# Patient Record
Sex: Female | Born: 1965 | Race: Black or African American | Hispanic: No | Marital: Married | Smoking: Never smoker
Health system: Southern US, Community
[De-identification: ages and names within clinical notes are randomized; demographics above are authoritative.]

## PROBLEM LIST (undated history)

## (undated) DIAGNOSIS — K219 Gastro-esophageal reflux disease without esophagitis: Secondary | ICD-10-CM

## (undated) DIAGNOSIS — T7840XA Allergy, unspecified, initial encounter: Secondary | ICD-10-CM

## (undated) DIAGNOSIS — D649 Anemia, unspecified: Secondary | ICD-10-CM

## (undated) DIAGNOSIS — F419 Anxiety disorder, unspecified: Secondary | ICD-10-CM

## (undated) DIAGNOSIS — R7303 Prediabetes: Secondary | ICD-10-CM

## (undated) HISTORY — DX: Anemia, unspecified: D64.9

## (undated) HISTORY — DX: Allergy, unspecified, initial encounter: T78.40XA

## (undated) HISTORY — PX: WRIST GANGLION EXCISION: SUR520

## (undated) HISTORY — PX: COSMETIC SURGERY: SHX468

## (undated) HISTORY — PX: KNEE ARTHROSCOPY: SUR90

## (undated) HISTORY — PX: BREAST REDUCTION SURGERY: SHX8

## (undated) HISTORY — PX: LAPAROSCOPIC GASTRIC BANDING: SHX1100

## (undated) HISTORY — PX: TONSILLECTOMY: SUR1361

## (undated) HISTORY — PX: NASAL SINUS SURGERY: SHX719

---

## 1998-05-14 ENCOUNTER — Encounter: Payer: Self-pay | Admitting: Obstetrics & Gynecology

## 1998-05-14 ENCOUNTER — Ambulatory Visit (HOSPITAL_COMMUNITY): Admission: RE | Admit: 1998-05-14 | Discharge: 1998-05-14 | Payer: Self-pay | Admitting: Obstetrics & Gynecology

## 1998-06-27 ENCOUNTER — Inpatient Hospital Stay (HOSPITAL_COMMUNITY): Admission: AD | Admit: 1998-06-27 | Discharge: 1998-06-27 | Payer: Self-pay | Admitting: Obstetrics and Gynecology

## 1998-07-31 ENCOUNTER — Encounter: Admission: RE | Admit: 1998-07-31 | Discharge: 1998-10-29 | Payer: Self-pay | Admitting: Obstetrics and Gynecology

## 1998-08-24 ENCOUNTER — Inpatient Hospital Stay (HOSPITAL_COMMUNITY): Admission: AD | Admit: 1998-08-24 | Discharge: 1998-08-24 | Payer: Self-pay | Admitting: Obstetrics and Gynecology

## 1998-08-29 ENCOUNTER — Inpatient Hospital Stay (HOSPITAL_COMMUNITY): Admission: AD | Admit: 1998-08-29 | Discharge: 1998-08-29 | Payer: Self-pay | Admitting: Obstetrics and Gynecology

## 1998-08-31 ENCOUNTER — Inpatient Hospital Stay (HOSPITAL_COMMUNITY): Admission: AD | Admit: 1998-08-31 | Discharge: 1998-09-02 | Payer: Self-pay | Admitting: *Deleted

## 2001-10-01 ENCOUNTER — Other Ambulatory Visit: Admission: RE | Admit: 2001-10-01 | Discharge: 2001-10-01 | Payer: Self-pay | Admitting: Obstetrics and Gynecology

## 2002-05-30 ENCOUNTER — Ambulatory Visit (HOSPITAL_COMMUNITY): Admission: RE | Admit: 2002-05-30 | Discharge: 2002-05-30 | Payer: Self-pay | Admitting: Family Medicine

## 2002-05-30 ENCOUNTER — Encounter: Payer: Self-pay | Admitting: Family Medicine

## 2002-12-25 ENCOUNTER — Other Ambulatory Visit: Admission: RE | Admit: 2002-12-25 | Discharge: 2002-12-25 | Payer: Self-pay | Admitting: Obstetrics and Gynecology

## 2003-03-13 ENCOUNTER — Encounter: Admission: RE | Admit: 2003-03-13 | Discharge: 2003-06-11 | Payer: Self-pay | Admitting: Obstetrics and Gynecology

## 2003-05-07 ENCOUNTER — Inpatient Hospital Stay (HOSPITAL_COMMUNITY): Admission: AD | Admit: 2003-05-07 | Discharge: 2003-05-07 | Payer: Self-pay | Admitting: Family Medicine

## 2003-09-11 ENCOUNTER — Inpatient Hospital Stay (HOSPITAL_COMMUNITY): Admission: AD | Admit: 2003-09-11 | Discharge: 2003-09-15 | Payer: Self-pay | Admitting: Pediatrics

## 2003-09-16 ENCOUNTER — Encounter: Admission: RE | Admit: 2003-09-16 | Discharge: 2003-10-16 | Payer: Self-pay | Admitting: Obstetrics and Gynecology

## 2003-10-17 ENCOUNTER — Encounter: Admission: RE | Admit: 2003-10-17 | Discharge: 2003-11-16 | Payer: Self-pay | Admitting: Obstetrics and Gynecology

## 2003-10-23 ENCOUNTER — Other Ambulatory Visit: Admission: RE | Admit: 2003-10-23 | Discharge: 2003-10-23 | Payer: Self-pay | Admitting: Obstetrics and Gynecology

## 2003-12-15 ENCOUNTER — Encounter: Admission: RE | Admit: 2003-12-15 | Discharge: 2004-01-14 | Payer: Self-pay | Admitting: Obstetrics and Gynecology

## 2004-02-14 ENCOUNTER — Encounter: Admission: RE | Admit: 2004-02-14 | Discharge: 2004-03-15 | Payer: Self-pay | Admitting: Obstetrics and Gynecology

## 2004-04-15 ENCOUNTER — Encounter: Admission: RE | Admit: 2004-04-15 | Discharge: 2004-05-15 | Payer: Self-pay | Admitting: Obstetrics and Gynecology

## 2004-05-16 ENCOUNTER — Encounter: Admission: RE | Admit: 2004-05-16 | Discharge: 2004-06-15 | Payer: Self-pay | Admitting: Obstetrics and Gynecology

## 2004-07-16 ENCOUNTER — Encounter: Admission: RE | Admit: 2004-07-16 | Discharge: 2004-08-15 | Payer: Self-pay | Admitting: Obstetrics and Gynecology

## 2004-09-16 ENCOUNTER — Ambulatory Visit (HOSPITAL_COMMUNITY): Admission: RE | Admit: 2004-09-16 | Discharge: 2004-09-16 | Payer: Self-pay | Admitting: Family Medicine

## 2004-11-26 ENCOUNTER — Encounter: Admission: RE | Admit: 2004-11-26 | Discharge: 2005-02-24 | Payer: Self-pay | Admitting: Family Medicine

## 2005-05-16 ENCOUNTER — Encounter: Admission: RE | Admit: 2005-05-16 | Discharge: 2005-08-14 | Payer: Self-pay | Admitting: Surgery

## 2005-05-17 ENCOUNTER — Ambulatory Visit (HOSPITAL_COMMUNITY): Admission: RE | Admit: 2005-05-17 | Discharge: 2005-05-17 | Payer: Self-pay | Admitting: Surgery

## 2005-06-02 ENCOUNTER — Ambulatory Visit (HOSPITAL_COMMUNITY): Admission: RE | Admit: 2005-06-02 | Discharge: 2005-06-02 | Payer: Self-pay | Admitting: Surgery

## 2005-07-02 ENCOUNTER — Ambulatory Visit (HOSPITAL_BASED_OUTPATIENT_CLINIC_OR_DEPARTMENT_OTHER): Admission: RE | Admit: 2005-07-02 | Discharge: 2005-07-02 | Payer: Self-pay | Admitting: Surgery

## 2005-07-10 ENCOUNTER — Ambulatory Visit: Payer: Self-pay | Admitting: Internal Medicine

## 2006-03-02 ENCOUNTER — Encounter: Admission: RE | Admit: 2006-03-02 | Discharge: 2006-03-16 | Payer: Self-pay | Admitting: Family Medicine

## 2006-03-20 ENCOUNTER — Ambulatory Visit (HOSPITAL_COMMUNITY): Admission: RE | Admit: 2006-03-20 | Discharge: 2006-03-21 | Payer: Self-pay | Admitting: Surgery

## 2006-03-20 ENCOUNTER — Encounter (INDEPENDENT_AMBULATORY_CARE_PROVIDER_SITE_OTHER): Payer: Self-pay | Admitting: Specialist

## 2006-04-13 ENCOUNTER — Encounter: Admission: RE | Admit: 2006-04-13 | Discharge: 2006-04-28 | Payer: Self-pay | Admitting: Family Medicine

## 2006-11-02 ENCOUNTER — Emergency Department (HOSPITAL_COMMUNITY): Admission: EM | Admit: 2006-11-02 | Discharge: 2006-11-03 | Payer: Self-pay | Admitting: Emergency Medicine

## 2009-06-17 ENCOUNTER — Ambulatory Visit: Payer: Self-pay | Admitting: Diagnostic Radiology

## 2009-06-17 ENCOUNTER — Emergency Department (HOSPITAL_BASED_OUTPATIENT_CLINIC_OR_DEPARTMENT_OTHER): Admission: EM | Admit: 2009-06-17 | Discharge: 2009-06-17 | Payer: Self-pay | Admitting: Emergency Medicine

## 2011-01-14 NOTE — Procedures (Signed)
NAME:  Rachael White, Rachael White              ACCOUNT NO.:  [id number]   MEDICAL RECORD NO.:  [id number]          PATIENT TYPE:  OUT   LOCATION:  SLEEP CENTER                 FACILITY:  [facility]   PHYSICIAN:  Clinton D. Maple Hudson, M.D. DATE OF BIRTH:  11/05/1965   DATE OF STUDY:  07/02/2005                              NOCTURNAL POLYSOMNOGRAM   REFERRING PHYSICIAN:  Thornton Park. Daphine Deutscher, MD.   INDICATION FOR STUDY:  Hypersomnia with sleep apnea.   EPWORTH SLEEPINESS SCORE:  12/24.  BMI 45, weight 287 pounds.   MEDICATIONS:  Home medications include Adderall, Nexium and a vitamin.   SLEEP ARCHITECTURE:  Total sleep time 397 minutes with sleep efficiency 96%.  Stage 1 4%, stage 2 54%, stages 3 and 4 21%.  REM 21% of total sleep time.  Sleep latency 8 minutes.  REM latency 69 minutes.  Awake after sleep onset  10 minutes.  Arousal index 16.  No bedtime medication taken.  Sleep  architecture was noteworthy for increased stages 3 and 4.  This may be seen  with rebound from previous sleep deprivation.   RESPIRATORY DATA:  Apnea/hypopnea index (AHI, RDI) 7.4 obstructive events  per hour indicating mild obstructive sleep apnea/hypopnea syndrome.  This  reflected 27 obstructive apneas and 22 hypopneas.  Events were associated  primarily with sleep on left side and right side.  She slept little supine.  REM AHI 18.8 per hour.  There were insufficient events to qualify for C-PAP  titration by split study protocol on this study night.   OXYGEN DATA:  Moderate snoring with oxygen desaturation to a nadir of 85%.  Mean oxygen saturation through the study was 98% on room air.   CARDIAC DATA:  Normal sinus rhythm.   MOVEMENT-PARASOMNIA:  A total of 68 limb jerks were recorded of which 12  were associated with arousal or awakening for a periodic limb movement with  arousal index of 1.8 per hour which is probably insignificant.   IMPRESSIONS-RECOMMENDATIONS:  1.  Sleep architecture significant for increased delta  sleep (stages 3 and      4) possibly on basis of rebound from previous sleep deprivation.  2.  Mild obstructive sleep apnea/hypopnea syndrome, AHI 7.4 per hour noted      mostly while in REM.  There was moderate snoring with oxygen      desaturation to 85%.  3.  C-PAP would not usually be used to treat scores in this range.      Conservative therapy including weight loss and      treatment for any nasal congestion would be considered first.      Occasionally REM suppressant therapy with a tricyclic antidepressant      might be useful.      Clinton D. Maple Hudson, M.D.  Diplomate, Biomedical engineer of Sleep Medicine  Electronically Signed     CDY/MEDQ  D:  07/10/2005 09:02:50  T:  07/11/2005 09:42:01  Job:  [id number]

## 2011-01-14 NOTE — Op Note (Signed)
Rachael White, Rachael White              ACCOUNT NO.:  [id number]   MEDICAL RECORD NO.:  [id number]          PATIENT TYPE:  AMB   LOCATION:  DAY                          FACILITY:  [facility]   PHYSICIAN:  Thornton Park. Daphine Deutscher, MD  DATE OF BIRTH:  July 27, 1966   DATE OF PROCEDURE:  DATE OF DISCHARGE:                                 OPERATIVE REPORT   PREOPERATIVE DIAGNOSIS:  Morbid obesity.   POSTOPERATIVE DIAGNOSIS:  Morbid obesity, status post laparoscopic truncal  vagotomy, laparoscopic hiatal hernia repair, and placement of 10 cm  laparoscopic adjustable gastric band (Allergan).   SURGEON:  Thornton Park. Daphine Deutscher, MD   ASSISTANT:  Alfonse Ras, MD   ANESTHESIA:  General endotracheal.   DESCRIPTION OF PROCEDURE:  Ms. Rachael White is 45 years old, was taken to room 1 on  07/23 and given general anesthesia.  Abdomen was prepped with Hibiclens and  draped sterilely.  Access to the abdomen was gained through the left upper  quadrant using an OptiVu.  Although she was very deep we entered without  difficulty and the abdomen was insufflated.  Two placed in the right side  and a 10 on the left side and 5 in the upper midline and auxiliary 5 lateral  to the initial one were placed in our standard fashion.  The Nathanson's  retractor was placed a through a 5 mm port in the upper abdomen retracting  the liver.   The dissection began by taking down the gastrohepatic ligament and then  opening just medial to the right crus and creating a space retroesophageal  region where I was able to dissect away the right posterior vagal trunk.  This was then clipped and removed and this was grossly inspected and felt to  be vagus.   Anteriorly I dissected the anterior vagal trunk.  It was a little more  difficult to find but I found it pretty much on the left anterior lateral  wall and again clipped it and removed it.  I also had reviewed her upper GI  and knew her to have a small hiatal hernia and I elected to go  ahead and put  a single stitch with the Endo stitch tying that extracorporeally.  The  sizing catheter was passed but that was done after we checked for the  __________ of the vagotomy.  This down with an upper endoscopy performed by  me and up in the endoscope was passed in the stomach, retroflexed.  I also  assessed for hiatal hernia and it was not a real large one but again I  closed the hiatus.  I went ahead and retroflexed and then did a Congo red  study after baclofen stimulation and after irrigating and there was all the  dye was red.  There was no change to a dark blue which would signify acid.   After the posterior hiatus was closed, I then created my posterior band  dissection and then passed a 10 cm band clipping it in place.  It was then  plicated with three sutures using Surgidac placed with free sutures.  Lap  ties  were placed in each of these.   The band had been introduced through a 15 port and at that site, I used cut  a cut down and created a space for the actual port site.  This was connected  and sutured to the fascia with interrupted 2-0 Prolene.   The patient's wounds were closed with 4-0 Vicryl, Benzoin and Steri-Strips.  She was awakened but was somewhat rowdy and rambunctious with awakening and  was taken to recovery room in satisfactory condition.      Thornton Park Daphine Deutscher, MD  Electronically Signed     MBM/MEDQ  D:  03/20/2006  T:  03/20/2006  Job:  [id number]   cc:   Holley Bouche, M.D.  Fax: [phone]

## 2011-02-18 ENCOUNTER — Ambulatory Visit (HOSPITAL_BASED_OUTPATIENT_CLINIC_OR_DEPARTMENT_OTHER)
Admission: RE | Admit: 2011-02-18 | Discharge: 2011-02-18 | Disposition: A | Discharge status: Home / Self Care | Payer: Managed Care, Other (non HMO) | Source: Ambulatory Visit | Attending: Specialist | Admitting: Specialist

## 2011-02-18 DIAGNOSIS — M2240 Chondromalacia patellae, unspecified knee: Secondary | ICD-10-CM | POA: Insufficient documentation

## 2011-02-18 DIAGNOSIS — M9420 Chondromalacia, unspecified site: Secondary | ICD-10-CM | POA: Insufficient documentation

## 2011-02-18 DIAGNOSIS — E669 Obesity, unspecified: Secondary | ICD-10-CM | POA: Insufficient documentation

## 2011-02-18 DIAGNOSIS — M23329 Other meniscus derangements, posterior horn of medial meniscus, unspecified knee: Secondary | ICD-10-CM | POA: Insufficient documentation

## 2011-02-18 DIAGNOSIS — M1710 Unilateral primary osteoarthritis, unspecified knee: Secondary | ICD-10-CM | POA: Insufficient documentation

## 2011-02-18 DIAGNOSIS — Z01812 Encounter for preprocedural laboratory examination: Secondary | ICD-10-CM | POA: Insufficient documentation

## 2011-02-26 NOTE — Op Note (Signed)
Rachael White, Rachael White NO.:  [id number]  MEDICAL RECORD NO.:  [id number]  LOCATION:                                 FACILITY:  PHYSICIAN:  Jene Every, M.D.    DATE OF BIRTH:  Nov 07, 1965  DATE OF PROCEDURE:  02/18/2011 DATE OF DISCHARGE:                              OPERATIVE REPORT   PREOPERATIVE DIAGNOSIS:  Degenerative joint disease, meniscus tear, left knee.  POSTOPERATIVE DIAGNOSES: 1. Grade III chondromalacia, patellofemoral joint. 2. Medial meniscus tear. 3. 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 4. 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  PROCEDURE PERFORMED: 1. Left knee arthroscopy. 2. Partial medial meniscectomy. 3. Chondroplasty of medial femoral condyle, femoral sulcus, patella     and tibial plateau.  HISTORY:  [age]-year-old with knee pain, locking, popping and giving way.  MRI indicating meniscus tear, indicated for arthroscopic intervention failing conservative treatment.  Risk and benefits discussed including bleeding, infection, no change in symptoms, worsening symptoms, need for repeat debridement, DVT, PE, anesthetic complications, etc.,  TECHNIQUE:  Patient in supine position after induction of adequate general anesthesia and 1 g of Kefzol, the left lower extremity was prepped and draped in the usual sterile fashion.  A lateral parapatellar portal and superomedial parapatellar portal was fashioned with a #11 blade.  Ingress cannula atraumatically placed.  Irrigant was utilized to insufflate the joint.  Under direct visualization, a medial parapatellar portal was fashioned with a #11 blade after localization with an 18 gauge needle, sparing the medial meniscus.  There was extensive grade III changes in the medial femoral condyle with chondral flap tears.  A 3.5 Cuda shaver  was introduced utilized to perform a light chondroplasty to a stable base.  In addition, a mildly complex tear of the posterior  third of the medial meniscus was noted unstable, introduced a basket rongeur and performed a partial medial meniscectomy to a stable base.  Further contoured with a 3.5 Cuda shaver. Approximately  one-third of the posterior third was excised.  No grade IV changes were noted.  ACL and PCL were unremarkable.  Lateral compartment revealed a chondral fissure in the lateral tibial plateau with some grade III changes.  Light chondroplasty performed here.  Some mild fraying of the meniscus.  This was shaved as well.  The remnant of the meniscus was, however, stable to probe palpation.  Examination of the suprapatellar pouch revealed extensive grade III changes of the patella.  There was normal patellofemoral tracking.  The sulcus was involved as well.  Light chondroplasty performed at the patella and the sulcus.  No grade IV changes noted, although fairly significant in terms of grade III changes.  Next, the knee was copiously lavaged.  All compartments were reexamined. No further pathology amenable for arthroscopic intervention, I therefore removed all instrumentation.  Portals were closed with 4-0 nylon simple sutures.  0.25% Marcaine with epinephrine was infiltrated in the joint and wound was dressed sterilely.  Awoken without difficulty and transported to recovery room in satisfactory condition.  Patient tolerated the procedure well.  COMPLICATIONS:  No complications.  ASSISTANT:  None.     Jene Every, M.D.     JB/MEDQ  D:  02/18/2011  T:  02/18/2011  Job:  [id number]  Electronically Signed by Jene Every M.D. on 02/26/2011 09:05:06 AM

## 2011-03-18 ENCOUNTER — Ambulatory Visit (HOSPITAL_BASED_OUTPATIENT_CLINIC_OR_DEPARTMENT_OTHER)
Admission: RE | Admit: 2011-03-18 | Discharge: 2011-03-18 | Disposition: A | Discharge status: Home / Self Care | Payer: Managed Care, Other (non HMO) | Source: Ambulatory Visit | Attending: Specialist | Admitting: Specialist

## 2011-03-18 DIAGNOSIS — M1710 Unilateral primary osteoarthritis, unspecified knee: Secondary | ICD-10-CM | POA: Insufficient documentation

## 2011-03-18 DIAGNOSIS — M23329 Other meniscus derangements, posterior horn of medial meniscus, unspecified knee: Secondary | ICD-10-CM | POA: Insufficient documentation

## 2011-03-18 DIAGNOSIS — M2240 Chondromalacia patellae, unspecified knee: Secondary | ICD-10-CM | POA: Insufficient documentation

## 2011-03-18 DIAGNOSIS — M2340 Loose body in knee, unspecified knee: Secondary | ICD-10-CM | POA: Insufficient documentation

## 2011-03-18 DIAGNOSIS — Z01812 Encounter for preprocedural laboratory examination: Secondary | ICD-10-CM | POA: Insufficient documentation

## 2011-03-21 LAB — POCT HEMOGLOBIN-HEMACUE: Hemoglobin: 8.1 g/dL — ABNORMAL LOW (ref 12.0–15.0)

## 2011-03-27 NOTE — Op Note (Signed)
  [name], Rachael White NO.:  [id number]  MEDICAL RECORD NO.:  [id number]  LOCATION:                                 FACILITY:  PHYSICIAN:  Jene Every, M.D.    DATE OF BIRTH:  06-26-1966  DATE OF PROCEDURE:  03/18/2011 DATE OF DISCHARGE:                              OPERATIVE REPORT   PREOPERATIVE DIAGNOSIS:  Degenerative joint disease, right knee.  POSTOPERATIVE DIAGNOSES:  Grade 3 chondromalacia, medial femoral condyle with loose cartilaginous debris and medial meniscus tear; grade 3 chondromalacia of lateral compartment and tibial plateau, minor fraying of the lateral meniscus; grade 3 chondromalacia of patella.  PROCEDURE PERFORMED:  Right knee arthroscopy, partial medial meniscectomy, chondroplasty of medial femoral condyle, patella, shaving of the posterior aspect of the lateral tibial plateau.  ANESTHESIA:  General.  ASSISTANT:  None.  BRIEF HISTORY:  45 year old with knee pain, locking, giving way, indicated for arthroscopic debridement failing conservative treatment. Risks and benefits discussed including infection, no change in symptoms, worsening symptoms, need for repeat debridement, DVT, PE, anesthetic complications, etc.  TECHNIQUE:  The patient in supine position after induction of adequate anesthesia and 1 g of vancomycin based on lean body weight, the right lower extremity was prepped and draped in usual sterile fashion.  A lateral parapatellar portal was fashioned with a #11 blade.  Ingress cannula atraumatically placed.  Irrigant was utilized to insufflate the joint.  Under direct visualization, a medial parapatellar portal was fashioned with a #11 blade after localization with 18 gauge needle, sparing the medial meniscus.  Noted was degenerative tearing of the posterior horn of the medial meniscus, grade 3 change of medial femoral condyle, loose cartilaginous debris.  Minor grade 2 changes of tibial plateau.  Introduced a Warehouse manager and resected approximately 20% of posterior third to a stable base, further contoured with 3.5 Cuda shaver.  Light chondroplasty of femoral condyle was performed and lavaged and evacuated loose cartilaginous debris.  ACL was unremarkable.  Lateral compartment revealed grade 3 changes of tibial plateau and posteriorly.  Minor radial fraying of the meniscus, I shaved the meniscus, performed a light chondroplasty of the tibial plateau.  Next, suprapatellar pouch revealed grade 3 chondromalacia of patella with normal patellofemoral tracking.  Light chondroplasty performed here.  Gutters were unremarkable.  I copiously lavaged the knee and reinspected all compartments.  No further pathology amenable to arthroscopic intervention, therefore removed all instrumentation. Portals were closed with 4-0 nylon simple sutures, 0.25% Marcaine with epinephrine was infiltrated in the joint. Wound was dressed sterilely.  Was awoken without difficulty and transported to recovery room in satisfactory condition.  The patient tolerated the procedure well.  No complications.  No assistant.     Jene Every, M.D.     Cordelia Pen  D:  03/18/2011  T:  03/18/2011  Job:  [id number]  Electronically Signed by Jene Every M.D. on 03/27/2011 09:50:05 AM

## 2012-02-11 ENCOUNTER — Emergency Department (HOSPITAL_COMMUNITY)
Admission: EM | Admit: 2012-02-11 | Discharge: 2012-02-11 | Disposition: A | Discharge status: Home / Self Care | Payer: Managed Care, Other (non HMO) | Attending: Emergency Medicine | Admitting: Emergency Medicine

## 2012-02-11 ENCOUNTER — Emergency Department (HOSPITAL_COMMUNITY): Payer: Managed Care, Other (non HMO)

## 2012-02-11 ENCOUNTER — Encounter (HOSPITAL_COMMUNITY): Payer: Self-pay | Admitting: Emergency Medicine

## 2012-02-11 DIAGNOSIS — R0789 Other chest pain: Secondary | ICD-10-CM | POA: Insufficient documentation

## 2012-02-11 DIAGNOSIS — R450 Nervousness: Secondary | ICD-10-CM | POA: Insufficient documentation

## 2012-02-11 DIAGNOSIS — F4321 Adjustment disorder with depressed mood: Secondary | ICD-10-CM | POA: Insufficient documentation

## 2012-02-11 DIAGNOSIS — F411 Generalized anxiety disorder: Secondary | ICD-10-CM | POA: Insufficient documentation

## 2012-02-11 LAB — DIFFERENTIAL
Basophils Absolute: 0 10*3/uL (ref 0.0–0.1)
Eosinophils Absolute: 0.1 10*3/uL (ref 0.0–0.7)
Lymphocytes Relative: 44 % (ref 12–46)
Monocytes Absolute: 0.5 10*3/uL (ref 0.1–1.0)
Neutro Abs: 3.4 10*3/uL (ref 1.7–7.7)

## 2012-02-11 LAB — CBC
HCT: 33.5 % — ABNORMAL LOW (ref 36.0–46.0)
MCH: 27.1 pg (ref 26.0–34.0)
MCHC: 33.1 g/dL (ref 30.0–36.0)
MCV: 81.9 fL (ref 78.0–100.0)
RBC: 4.09 MIL/uL (ref 3.87–5.11)
RDW: 15.3 % (ref 11.5–15.5)

## 2012-02-11 LAB — COMPREHENSIVE METABOLIC PANEL
ALT: 16 U/L (ref 0–35)
Alkaline Phosphatase: 48 U/L (ref 39–117)
CO2: 27 mEq/L (ref 19–32)
Calcium: 9.3 mg/dL (ref 8.4–10.5)
Creatinine, Ser: 0.7 mg/dL (ref 0.50–1.10)
GFR calc Af Amer: >90 mL/min (ref >90)
Glucose, Bld: 168 mg/dL — ABNORMAL HIGH (ref 70–99)
Potassium: 3.9 mEq/L (ref 3.5–5.1)
Sodium: 141 mEq/L (ref 135–145)

## 2012-02-11 MED ORDER — LORAZEPAM 1 MG PO TABS
ORAL_TABLET | ORAL | Status: AC
  Administered 2012-02-11: 1 mg via ORAL
  Filled 2012-02-11: qty 1

## 2012-02-11 MED ORDER — LORAZEPAM 1 MG PO TABS
1.0000 mg | ORAL_TABLET | Freq: Once | ORAL | Status: AC
  Administered 2012-02-11: 1 mg via ORAL

## 2012-02-11 NOTE — ED Notes (Signed)
Patient complaining of mid-sternal chest pain that started about 10 minutes ago.  Reports radiation to back; denies nausea, vomiting, and shortness of breath.  Patient just received stressful news that her family member passed away.

## 2012-02-11 NOTE — Discharge Instructions (Signed)
Grief Reaction  Grief is a normal response to the death of someone close to you. Feelings of fear, anger, and guilt can affect almost everyone who loses someone they love. Symptoms of depression are also common. These include problems with sleep, loss of appetite, and lack of energy. These grief reaction symptoms often last for weeks to months after a loss. They may also return during special times that remind you of the person you lost, such as an anniversary or birthday.  Anxiety, insomnia, irritability, and deep depression may last beyond the period of normal grief. If you experience these feelings for 6 months or longer, you may have clinical depression. Clinical depression requires further medical attention. If you think that you have clinical depression, you should contact your caregiver. If you have a history of depression and or a family history of depression, you are at greater risk of clinical depression. You are also at greater risk of developing clinical depression if the loss was traumatic or the loss was of someone with whom you had unresolved issues.    A grief reaction can become complicated by being blocked. This means being unable to cry or express extreme emotions. This may prolong the grieving period and worsen the emotional effects of the loss. Mourning is a natural event in human life. A healthy grief reaction is one that is not blocked . It requires a time of sadness and readjustment.It is very important to share your sorrow and fear with others, especially close friends and family. Professional counselors and clergy can also help you process your grief.  Document Released: 08/15/2005 Document Revised: 08/04/2011 Document Reviewed: 04/25/2006  Exit[employer] Patient Information 2012 [employer].

## 2012-02-11 NOTE — ED Provider Notes (Signed)
History     CSN: [id number]  Arrival date & time 02/11/12  2016   None     Chief Complaint  Patient presents with  . Chest Pain    (Consider location/radiation/quality/duration/timing/severity/associated sxs/prior treatment) HPI Comments: Patient has been in the hospital in the emergency department, after the loss of a family member.  She is extended family with her.  She does have a history of anxiety and panic attacks, normally, taking Valium as needed, but she does not have any at this time.  Since she has been isolated from the rest of the family and sitting quietly.  Her pain has resolved, resulting in just a soreness in her upper chest wall.  Patient is a 46 y.o. female presenting with chest pain. The history is provided by the patient.  Chest Pain The chest pain began less than 1 hour ago. Chest pain occurs constantly. The chest pain is unchanged. The pain is associated with stress. At its most intense, the pain is at 6/10. The severity of the pain is moderate. The quality of the pain is described as dull. Pertinent negatives for primary symptoms include no shortness of breath, no nausea and no dizziness.  Pertinent negatives for associated symptoms include no diaphoresis and no weakness.     History reviewed. No pertinent past medical history.  History reviewed. No pertinent past surgical history.  History reviewed. No pertinent family history.  History  Substance Use Topics  . Smoking status: Never Smoker   . Smokeless tobacco: Not on file  . Alcohol Use: No    OB History    Grav Para Term Preterm Abortions TAB SAB Ect Mult Living                  Review of Systems  Constitutional: Negative for diaphoresis.  Respiratory: Positive for chest tightness. Negative for shortness of breath.   Cardiovascular: Positive for chest pain.  Gastrointestinal: Negative for nausea.  Neurological: Negative for dizziness, weakness and headaches.  Psychiatric/Behavioral: The  patient is nervous/anxious.     Allergies  Penicillins  Home Medications  No current outpatient prescriptions on file.  BP 141/98  Pulse 102  Temp 98.1 F (36.7 C) (Oral)  Resp 22  SpO2 96%  LMP 01/21/2012  Physical Exam  Constitutional: She appears well-developed.  HENT:  Head: Normocephalic.  Eyes: Pupils are equal, round, and reactive to light.  Neck: Normal range of motion.  Cardiovascular: Normal rate and regular rhythm.   Pulmonary/Chest: Effort normal and breath sounds normal. She exhibits no tenderness.  Musculoskeletal: Normal range of motion.  Neurological: She is alert.  Skin: Skin is warm.    ED Course  Procedures (including critical care time)  Labs Reviewed  CBC - Abnormal; Notable for the following:    Hemoglobin 11.1 (*)     HCT 33.5 (*)     All other components within normal limits  COMPREHENSIVE METABOLIC PANEL - Abnormal; Notable for the following:    Glucose, Bld 168 (*)     All other components within normal limits  DIFFERENTIAL  POCT I-STAT TROPONIN I   Dg Chest 2 View  02/11/2012  *RADIOLOGY REPORT*  Clinical Data: Chest tightness.  CHEST - 2 VIEW  Comparison: 06/17/2009.  Findings: No infiltrate, congestive heart failure or pneumothorax. Heart size within normal limits.  IMPRESSION: No acute abnormality.  Original Report Authenticated By: Fuller Canada, M.D.     1. Grief reaction     ED ECG REPORT  Date: 02/11/2012  EKG Time: 10:27 PM  Rate:88  Rhythm: normal sinus rhythm,  unchanged from previous tracings  Axis: normal  Intervals:none  ST&T Change: none  Narrative Interpretation: noraml            MDM   Anxiety/panic attack, acute grief reaction to loss of family member        Arman Filter, NP 02/11/12 2226  Arman Filter, NP 02/11/12 2227  Arman Filter, NP 02/11/12 2227

## 2012-02-11 NOTE — ED Notes (Signed)
Patient has requested to wait in CDU 12 with family members until her room is ready.

## 2012-02-12 NOTE — ED Provider Notes (Signed)
Medical screening examination/treatment/procedure(s) were performed by non-physician practitioner and as supervising physician I was immediately available for consultation/collaboration.    Micaylah Bertucci L Alder Murri, MD 02/12/12 1519 

## 2012-06-13 ENCOUNTER — Other Ambulatory Visit (INDEPENDENT_AMBULATORY_CARE_PROVIDER_SITE_OTHER): Payer: Self-pay | Admitting: General Surgery

## 2012-06-13 ENCOUNTER — Encounter (INDEPENDENT_AMBULATORY_CARE_PROVIDER_SITE_OTHER): Payer: Self-pay | Admitting: Surgery

## 2012-06-13 ENCOUNTER — Ambulatory Visit (INDEPENDENT_AMBULATORY_CARE_PROVIDER_SITE_OTHER): Payer: Managed Care, Other (non HMO) | Admitting: Surgery

## 2012-06-13 VITALS — BP 170/98 | HR 100 | Temp 97.2°F | Resp 16 | Ht 66.5 in | Wt 262.2 lb

## 2012-06-13 DIAGNOSIS — Z9884 Bariatric surgery status: Secondary | ICD-10-CM

## 2012-06-13 DIAGNOSIS — R1011 Right upper quadrant pain: Secondary | ICD-10-CM

## 2012-06-13 NOTE — Progress Notes (Signed)
Lamaya Hyneman Fouche 46 y.o.  Body mass index is 41.69 kg/(m^2).  There is no problem list on file for this patient.   Allergies  Allergen Reactions  . Penicillins     Past Surgical History  Procedure Date  . Laparoscopic gastric banding With vagotomy July 2007    Lapband vagotomy study patient   [name], MD No diagnosis found.  Having ruq pain and fullness.  Suspect gallbladder.  Swallowing unclear.   Will get ultrasound and UGI Will see back after studies.  She doesn't have stomach growling.  She does have an aura of lemon taste cravings when she is hungry.     Matt B. Daphine Deutscher, MD, [facility] [phone] beeper [phone]  06/13/2012 5:20 PM

## 2012-06-13 NOTE — Patient Instructions (Signed)
Thanks for your patience.  If you need further assistance after leaving the office, please call our office and speak with a [facility] nurse.  [phone].  If you want to leave a message for Dr. Sylvania Moss, please call his office phone at [phone]. 

## 2012-06-14 ENCOUNTER — Telehealth (INDEPENDENT_AMBULATORY_CARE_PROVIDER_SITE_OTHER): Payer: Self-pay | Admitting: General Surgery

## 2012-06-14 NOTE — Telephone Encounter (Signed)
Called and left message for patient on home and cell phone to advise appointment date was obtained for both tests Dr. Daphine Deutscher requested during yesterday's office visit. The US Abdomen scheduled 06/21/12 at 9:30 with 9:15 arrival. The Upper GI scheduled same day at 10:30. Patient to go the [facility] location at [address].   Patient needs to be advised the Upper GI is not performed in the late afternoon. Patient NPO after midnight.

## 2012-06-21 ENCOUNTER — Ambulatory Visit
Admission: RE | Admit: 2012-06-21 | Discharge: 2012-06-21 | Disposition: A | Discharge status: Home / Self Care | Payer: Managed Care, Other (non HMO) | Source: Ambulatory Visit | Attending: Surgery | Admitting: Surgery

## 2012-06-21 ENCOUNTER — Other Ambulatory Visit (INDEPENDENT_AMBULATORY_CARE_PROVIDER_SITE_OTHER): Payer: Self-pay | Admitting: Surgery

## 2012-06-21 DIAGNOSIS — R1011 Right upper quadrant pain: Secondary | ICD-10-CM

## 2012-07-06 ENCOUNTER — Ambulatory Visit (INDEPENDENT_AMBULATORY_CARE_PROVIDER_SITE_OTHER): Payer: Managed Care, Other (non HMO) | Admitting: Surgery

## 2012-07-06 ENCOUNTER — Encounter (INDEPENDENT_AMBULATORY_CARE_PROVIDER_SITE_OTHER): Payer: Self-pay | Admitting: Surgery

## 2012-07-06 VITALS — BP 142/98 | HR 88 | Temp 97.6°F | Resp 16 | Ht 66.5 in | Wt 263.8 lb

## 2012-07-06 DIAGNOSIS — R1011 Right upper quadrant pain: Secondary | ICD-10-CM

## 2012-07-06 DIAGNOSIS — Z9884 Bariatric surgery status: Secondary | ICD-10-CM

## 2012-07-06 NOTE — Progress Notes (Signed)
Upper GI showed the band to be in good position and no obstruction. Ultrasound showed no gallstones. She is persisting in having this right upper quadrant pain and sometimes as a burning pain and she's had some episodes of distention and then some episodes of diarrhea. We will get a CT scan of her abdomen pelvis.  Attempted to access her band port and she has a fair degree of scar tissue around it. I was unable to get any fluid back in one point thought I was in and got no fluid but I was unable to get fluid in and get it to come out. Her port may be rotated or damaged. We'll get a good CT scan to see her back were a determination about her port and whether it's accessible or not.

## 2012-07-09 ENCOUNTER — Ambulatory Visit
Admission: RE | Admit: 2012-07-09 | Discharge: 2012-07-09 | Disposition: A | Discharge status: Home / Self Care | Payer: Managed Care, Other (non HMO) | Source: Ambulatory Visit | Attending: Surgery | Admitting: Surgery

## 2012-07-09 DIAGNOSIS — Z9884 Bariatric surgery status: Secondary | ICD-10-CM

## 2012-07-09 DIAGNOSIS — R1011 Right upper quadrant pain: Secondary | ICD-10-CM

## 2012-07-09 MED ORDER — IOHEXOL 300 MG/ML  SOLN
125.0000 mL | Freq: Once | INTRAMUSCULAR | Status: AC | PRN
  Administered 2012-07-09: 125 mL via INTRAVENOUS

## 2012-07-16 ENCOUNTER — Telehealth (INDEPENDENT_AMBULATORY_CARE_PROVIDER_SITE_OTHER): Payer: Self-pay | Admitting: General Surgery

## 2012-07-16 NOTE — Telephone Encounter (Signed)
Spoke with pt and informed her that her CT showed no acute findings.  I explained that after speaking w/ Dr. Daphine Deutscher he thinks she needs a revision.  I informed the patient that I have set up her orders and am sending them around to our surgery scheduler. They will call her to set up the surgery.  I also explained that what ever surgery date she decides on, no asp 5 days before then.  She said she understood.

## 2012-08-30 ENCOUNTER — Encounter (HOSPITAL_COMMUNITY): Payer: Self-pay | Admitting: Pharmacy Technician

## 2012-08-31 NOTE — Progress Notes (Signed)
08-31-12 0950 Need MD order entry in Epic. W. Kennon Portela

## 2012-09-02 ENCOUNTER — Other Ambulatory Visit (INDEPENDENT_AMBULATORY_CARE_PROVIDER_SITE_OTHER): Payer: Self-pay | Admitting: Surgery

## 2012-09-03 ENCOUNTER — Encounter (HOSPITAL_COMMUNITY): Payer: Self-pay

## 2012-09-03 ENCOUNTER — Encounter (HOSPITAL_COMMUNITY)
Admission: RE | Admit: 2012-09-03 | Discharge: 2012-09-03 | Disposition: A | Discharge status: Home / Self Care | Payer: Managed Care, Other (non HMO) | Source: Ambulatory Visit | Attending: Surgery | Admitting: Surgery

## 2012-09-03 HISTORY — DX: Anxiety disorder, unspecified: F41.9

## 2012-09-03 LAB — BASIC METABOLIC PANEL
CO2: 26 mEq/L (ref 19–32)
Chloride: 101 mEq/L (ref 96–112)
Glucose, Bld: 143 mg/dL — ABNORMAL HIGH (ref 70–99)
Potassium: 4.1 mEq/L (ref 3.5–5.1)
Sodium: 136 mEq/L (ref 135–145)

## 2012-09-03 LAB — SURGICAL PCR SCREEN
MRSA, PCR: NEGATIVE
Staphylococcus aureus: NEGATIVE

## 2012-09-03 NOTE — Pre-Procedure Instructions (Signed)
09-03-12 EKG / CXR 6'13 -Epic 

## 2012-09-03 NOTE — Patient Instructions (Addendum)
20 Opaline Reyburn Grippi  09/03/2012   Your procedure is scheduled on:1-7   -2014  Report to [facility] at  0900      AM.  Call this number if you have problems the morning of surgery: [phone]  Or Presurgical Testing [phone](Harmani Neto)   Remember: Follow any bowel prep instructions per MD office. One Fleet enema night before procedure. For Cpap use: Bring mask and tubing only.   Do not eat food:After Midnight.    Take these medicines the morning of surgery with A SIP OF WATER: Valium if desires   Do not wear jewelry, make-up or nail polish.  Do not wear lotions, powders, or perfumes. You may wear deodorant.  Do not shave 48 hours prior to surgery.(face and neck okay, no shaving of legs)  Do not bring valuables to the hospital.  Contacts, dentures or bridgework,body piercing,  may not be worn into surgery.  Leave suitcase in the car. After surgery it may be brought to your room.  For patients admitted to the hospital, checkout time is 11:00 AM the day of discharge.   Patients discharged the day of surgery will not be allowed to drive home. Must have responsible person with you x 24 hours once discharged.  Name and phone number of your driver: Bo Rogue, spouse [phone] cell   Special Instructions: CHG Shower Use Special Wash: see special instructions.(avoid face and genitals)   Please read over the following fact sheets that you were given: MRSA Information, Blood Transfusion fact sheet, Incentive Spirometry Instruction.    Failure to follow these instructions may result in Cancellation of your surgery.   Patient signature_______________________________________________________

## 2012-09-04 ENCOUNTER — Ambulatory Visit (HOSPITAL_COMMUNITY)
Admission: RE | Admit: 2012-09-04 | Discharge: 2012-09-04 | Disposition: A | Discharge status: Home / Self Care | Payer: Managed Care, Other (non HMO) | Source: Ambulatory Visit | Attending: Surgery | Admitting: Surgery

## 2012-09-04 ENCOUNTER — Encounter (HOSPITAL_COMMUNITY)
Admission: RE | Disposition: A | Discharge status: Home / Self Care | Payer: Self-pay | Source: Ambulatory Visit | Attending: Surgery

## 2012-09-04 ENCOUNTER — Encounter (HOSPITAL_COMMUNITY): Payer: Self-pay | Admitting: Anesthesiology

## 2012-09-04 ENCOUNTER — Encounter (HOSPITAL_COMMUNITY): Payer: Self-pay | Admitting: *Deleted

## 2012-09-04 ENCOUNTER — Ambulatory Visit (HOSPITAL_COMMUNITY): Payer: Managed Care, Other (non HMO) | Admitting: Anesthesiology

## 2012-09-04 DIAGNOSIS — F329 Major depressive disorder, single episode, unspecified: Secondary | ICD-10-CM | POA: Insufficient documentation

## 2012-09-04 DIAGNOSIS — L905 Scar conditions and fibrosis of skin: Secondary | ICD-10-CM | POA: Insufficient documentation

## 2012-09-04 DIAGNOSIS — F3289 Other specified depressive episodes: Secondary | ICD-10-CM | POA: Insufficient documentation

## 2012-09-04 DIAGNOSIS — Z01812 Encounter for preprocedural laboratory examination: Secondary | ICD-10-CM | POA: Insufficient documentation

## 2012-09-04 DIAGNOSIS — Y832 Surgical operation with anastomosis, bypass or graft as the cause of abnormal reaction of the patient, or of later complication, without mention of misadventure at the time of the procedure: Secondary | ICD-10-CM | POA: Insufficient documentation

## 2012-09-04 DIAGNOSIS — T85898A Other specified complication of other internal prosthetic devices, implants and grafts, initial encounter: Secondary | ICD-10-CM

## 2012-09-04 DIAGNOSIS — K929 Disease of digestive system, unspecified: Secondary | ICD-10-CM | POA: Insufficient documentation

## 2012-09-04 HISTORY — PX: GASTRIC BANDING PORT REVISION: SHX5246

## 2012-09-04 SURGERY — GASTRIC BANDING PORT REVISION
Anesthesia: General | Site: Abdomen | Wound class: Clean

## 2012-09-04 MED ORDER — ACETAMINOPHEN 10 MG/ML IV SOLN
INTRAVENOUS | Status: AC
  Filled 2012-09-04: qty 100

## 2012-09-04 MED ORDER — LACTATED RINGERS IV SOLN
INTRAVENOUS | Status: DC
  Administered 2012-09-04: 1000 mL via INTRAVENOUS

## 2012-09-04 MED ORDER — NEOSTIGMINE METHYLSULFATE 1 MG/ML IJ SOLN
INTRAMUSCULAR | Status: DC | PRN
  Administered 2012-09-04: 5 mg via INTRAVENOUS

## 2012-09-04 MED ORDER — ACETAMINOPHEN 10 MG/ML IV SOLN
INTRAVENOUS | Status: DC | PRN
  Administered 2012-09-04: 1000 mg via INTRAVENOUS

## 2012-09-04 MED ORDER — CISATRACURIUM BESYLATE (PF) 10 MG/5ML IV SOLN
INTRAVENOUS | Status: DC | PRN
  Administered 2012-09-04: 6 mg via INTRAVENOUS
  Administered 2012-09-04: 2 mg via INTRAVENOUS

## 2012-09-04 MED ORDER — CEFAZOLIN SODIUM 1-5 GM-% IV SOLN
INTRAVENOUS | Status: AC
  Filled 2012-09-04: qty 50

## 2012-09-04 MED ORDER — PROPOFOL 10 MG/ML IV EMUL
INTRAVENOUS | Status: DC | PRN
  Administered 2012-09-04: 200 mg via INTRAVENOUS

## 2012-09-04 MED ORDER — HEPARIN SODIUM (PORCINE) 5000 UNIT/ML IJ SOLN
5000.0000 [IU] | Freq: Once | INTRAMUSCULAR | Status: AC
  Administered 2012-09-04: 5000 [IU] via SUBCUTANEOUS
  Filled 2012-09-04: qty 1

## 2012-09-04 MED ORDER — LABETALOL HCL 5 MG/ML IV SOLN
INTRAVENOUS | Status: DC | PRN
  Administered 2012-09-04: 2.5 mg via INTRAVENOUS

## 2012-09-04 MED ORDER — PROMETHAZINE HCL 25 MG/ML IJ SOLN
6.2500 mg | INTRAMUSCULAR | Status: AC | PRN
  Administered 2012-09-04 (×2): 6.25 mg via INTRAVENOUS
  Filled 2012-09-04: qty 1

## 2012-09-04 MED ORDER — CEFAZOLIN SODIUM-DEXTROSE 2-3 GM-% IV SOLR
INTRAVENOUS | Status: AC
  Filled 2012-09-04: qty 50

## 2012-09-04 MED ORDER — ONDANSETRON HCL 4 MG/2ML IJ SOLN
INTRAMUSCULAR | Status: DC | PRN
  Administered 2012-09-04 (×2): 2 mg via INTRAVENOUS

## 2012-09-04 MED ORDER — CHLORHEXIDINE GLUCONATE 4 % EX LIQD
1.0000 "application " | Freq: Once | CUTANEOUS | Status: DC
  Filled 2012-09-04: qty 15

## 2012-09-04 MED ORDER — PROMETHAZINE HCL 25 MG/ML IJ SOLN
INTRAMUSCULAR | Status: AC
  Filled 2012-09-04: qty 1

## 2012-09-04 MED ORDER — MIDAZOLAM HCL 5 MG/5ML IJ SOLN
INTRAMUSCULAR | Status: DC | PRN
  Administered 2012-09-04 (×3): 1 mg via INTRAVENOUS

## 2012-09-04 MED ORDER — KETOROLAC TROMETHAMINE 30 MG/ML IJ SOLN
15.0000 mg | Freq: Once | INTRAMUSCULAR | Status: AC | PRN
  Administered 2012-09-04: 30 mg via INTRAVENOUS
  Filled 2012-09-04: qty 1

## 2012-09-04 MED ORDER — SODIUM CHLORIDE 0.9 % IJ SOLN
INTRAMUSCULAR | Status: DC | PRN
  Administered 2012-09-04: 10 mL via INTRAVENOUS

## 2012-09-04 MED ORDER — OXYCODONE-ACETAMINOPHEN 5-325 MG/5ML PO SOLN
5.0000 mL | Freq: Once | ORAL | Status: AC
  Administered 2012-09-04: 5 mL via ORAL
  Filled 2012-09-04: qty 5

## 2012-09-04 MED ORDER — DEXTROSE 5 % IV SOLN
3.0000 g | INTRAVENOUS | Status: AC
  Administered 2012-09-04: 3 g via INTRAVENOUS
  Filled 2012-09-04: qty 3000

## 2012-09-04 MED ORDER — LIDOCAINE HCL (CARDIAC) 20 MG/ML IV SOLN
INTRAVENOUS | Status: DC | PRN
  Administered 2012-09-04: 100 mg via INTRAVENOUS

## 2012-09-04 MED ORDER — FENTANYL CITRATE 0.05 MG/ML IJ SOLN: 25.0000 ug | INTRAMUSCULAR | Status: DC | PRN

## 2012-09-04 MED ORDER — GLYCOPYRROLATE 0.2 MG/ML IJ SOLN
INTRAMUSCULAR | Status: DC | PRN
  Administered 2012-09-04: .8 mg via INTRAVENOUS

## 2012-09-04 MED ORDER — SUCCINYLCHOLINE CHLORIDE 20 MG/ML IJ SOLN
INTRAMUSCULAR | Status: DC | PRN
  Administered 2012-09-04: 160 mg via INTRAVENOUS

## 2012-09-04 MED ORDER — DEXAMETHASONE SODIUM PHOSPHATE 10 MG/ML IJ SOLN
INTRAMUSCULAR | Status: DC | PRN
  Administered 2012-09-04: 10 mg via INTRAVENOUS

## 2012-09-04 MED ORDER — FENTANYL CITRATE 0.05 MG/ML IJ SOLN
INTRAMUSCULAR | Status: DC | PRN
  Administered 2012-09-04: 250 ug via INTRAVENOUS

## 2012-09-04 MED ORDER — OXYCODONE-ACETAMINOPHEN 5-325 MG/5ML PO SOLN: 5.0000 mL | ORAL | Status: DC | PRN

## 2012-09-04 SURGICAL SUPPLY — 27 items
BENZOIN TINCTURE PRP APPL 2/3 (GAUZE/BANDAGES/DRESSINGS) IMPLANT
BLADE HEX COATED 2.75 (ELECTRODE) ×2 IMPLANT
CANISTER SUCTION 2500CC (MISCELLANEOUS) ×2 IMPLANT
CLOTH BEACON ORANGE TIMEOUT ST (SAFETY) ×2 IMPLANT
DECANTER SPIKE VIAL GLASS SM (MISCELLANEOUS) ×4 IMPLANT
DERMABOND ADVANCED (GAUZE/BANDAGES/DRESSINGS) ×2
DERMABOND ADVANCED .7 DNX12 (GAUZE/BANDAGES/DRESSINGS) ×2 IMPLANT
DRAPE LAPAROSCOPIC ABDOMINAL (DRAPES) ×2 IMPLANT
ELECT REM PT RETURN 9FT ADLT (ELECTROSURGICAL) ×2
ELECTRODE REM PT RTRN 9FT ADLT (ELECTROSURGICAL) ×1 IMPLANT
GLOVE BIOGEL M 8.0 STRL (GLOVE) ×2 IMPLANT
GOWN STRL NON-REIN LRG LVL3 (GOWN DISPOSABLE) ×4 IMPLANT
GOWN STRL REIN XL XLG (GOWN DISPOSABLE) ×4 IMPLANT
KIT ACCESS PORT VG (Band) ×2 IMPLANT
KIT BASIN OR (CUSTOM PROCEDURE TRAY) ×2 IMPLANT
NEEDLE HYPO 22GX1.5 SAFETY (NEEDLE) ×2 IMPLANT
NS IRRIG 1000ML POUR BTL (IV SOLUTION) ×2 IMPLANT
PACK GENERAL/GYN (CUSTOM PROCEDURE TRAY) ×2 IMPLANT
STAPLER VISISTAT 35W (STAPLE) ×2 IMPLANT
STRIP CLOSURE SKIN 1/2X4 (GAUZE/BANDAGES/DRESSINGS) IMPLANT
SUT PROLENE 2 0 CT2 30 (SUTURE) ×8 IMPLANT
SUT VIC AB 2-0 SH 27 (SUTURE)
SUT VIC AB 2-0 SH 27X BRD (SUTURE) IMPLANT
SUT VIC AB 4-0 SH 18 (SUTURE) ×2 IMPLANT
SYR 30ML LL (SYRINGE) ×2 IMPLANT
SYR BULB IRRIGATION 50ML (SYRINGE) ×2 IMPLANT
SYRINGE 10CC LL (SYRINGE) ×2 IMPLANT

## 2012-09-04 NOTE — Anesthesia Preprocedure Evaluation (Signed)
Anesthesia Evaluation  Patient identified by MRN, date of birth, ID band Patient awake    Reviewed: Allergy & Precautions, H&P , NPO status , Patient's Chart, lab work & pertinent test results  Airway Mallampati: II TM Distance: <3 FB Neck ROM: Full    Dental No notable dental hx.    Pulmonary neg pulmonary ROS,  breath sounds clear to auscultation  Pulmonary exam normal       Cardiovascular negative cardio ROS  Rhythm:Regular Rate:Normal     Neuro/Psych negative neurological ROS  negative psych ROS   GI/Hepatic negative GI ROS, Neg liver ROS,   Endo/Other  Morbid obesity  Renal/GU negative Renal ROS  negative genitourinary   Musculoskeletal negative musculoskeletal ROS (+)   Abdominal   Peds negative pediatric ROS (+)  Hematology negative hematology ROS (+)   Anesthesia Other Findings   Reproductive/Obstetrics negative OB ROS                           Anesthesia Physical Anesthesia Plan  ASA: II  Anesthesia Plan: General   Post-op Pain Management:    Induction: Intravenous  Airway Management Planned: Oral ETT  Additional Equipment:   Intra-op Plan:   Post-operative Plan: Extubation in OR  Informed Consent: I have reviewed the patients History and Physical, chart, labs and discussed the procedure including the risks, benefits and alternatives for the proposed anesthesia with the patient or authorized representative who has indicated his/her understanding and acceptance.   Dental advisory given  Plan Discussed with: CRNA and Surgeon  Anesthesia Plan Comments:         Anesthesia Quick Evaluation  

## 2012-09-04 NOTE — H&P (Signed)
Chief Complaint:  lapband port turned  History of Present Illness:  Rachael White is an 47 y.o. female had lapband vagotomy and recently has had difficulty accessing port.  For lapband port revision  Past Medical History  Diagnosis Date  . Anxiety   . Depression     depression only    Past Surgical History  Procedure Date  . Laparoscopic gastric banding With vagotomy July 2007    Lapband vagotomy study patient    Current Facility-Administered Medications  Medication Dose Route Frequency Provider Last Rate Last Dose  . ceFAZolin (ANCEF) 3 g in dextrose 5 % 50 mL IVPB  3 g Intravenous On Call to OR Valarie Merino, MD      . chlorhexidine (HIBICLENS) 4 % liquid 1 application  1 application Topical Once Valarie Merino, MD      . chlorhexidine (HIBICLENS) 4 % liquid 1 application  1 application Topical Once Valarie Merino, MD      . lactated ringers infusion   Intravenous Continuous Gaylan Gerold, MD 125 mL/hr at 09/04/12 1203 1,000 mL at 09/04/12 1203   Penicillins Family History  Problem Relation Age of Onset  . Cancer Father     prostate   Social History:   reports that she has never smoked. She does not have any smokeless tobacco history on file. She reports that she drinks about .6 ounces of alcohol per week. She reports that she does not use illicit drugs.   REVIEW OF SYSTEMS - PERTINENT POSITIVES ONLY: noncontributory  Physical Exam:   Blood pressure 126/75, pulse 84, temperature 98.5 F (36.9 C), resp. rate 20, weight 276 lb 6 oz (125.363 kg), SpO2 98.00%. There is no height on file to calculate BMI.  Gen:  WDWN AAF NAD  Neurological: Alert and oriented to person, place, and time. Motor and sensory function is grossly intact  Head: Normocephalic and atraumatic.  Eyes: Conjunctivae are normal. Pupils are equal, round, and reactive to light. No scleral icterus.  Neck: Normal range of motion. Neck supple. No tracheal deviation or thyromegaly present.    Cardiovascular:  SR without murmurs or gallops.  No carotid bruits Respiratory: Effort normal.  No respiratory distress. No chest wall tenderness. Breath sounds normal.  No wheezes, rales or rhonchi.  Abdomen:  Port feels like it has pulled off the fascia GU: Musculoskeletal: Normal range of motion. Extremities are nontender. No cyanosis, edema or clubbing noted Lymphadenopathy: No cervical, preauricular, postauricular or axillary adenopathy is present Skin: Skin is warm and dry. No rash noted. No diaphoresis. No erythema. No pallor. Pscyh: Normal mood and affect. Behavior is normal. Judgment and thought content normal.   LABORATORY RESULTS: Results for orders placed during the hospital encounter of 09/03/12 (from the past 48 hour(s))  SURGICAL PCR SCREEN     Status: Normal   Collection Time   09/03/12 11:15 AM      Component Value Range Comment   MRSA, PCR NEGATIVE  NEGATIVE    Staphylococcus aureus NEGATIVE  NEGATIVE   HCG, SERUM, QUALITATIVE     Status: Normal   Collection Time   09/03/12 11:45 AM      Component Value Range Comment   Preg, Serum NEGATIVE  NEGATIVE   BASIC METABOLIC PANEL     Status: Abnormal   Collection Time   09/03/12 11:45 AM      Component Value Range Comment   Sodium 136  135 - 145 mEq/L    Potassium 4.1  3.5 - 5.1 mEq/L    Chloride 101  96 - 112 mEq/L    CO2 26  19 - 32 mEq/L    Glucose, Bld 143 (*) 70 - 99 mg/dL    BUN 7  6 - 23 mg/dL    Creatinine, Ser 1.61  0.50 - 1.10 mg/dL    Calcium 8.9  8.4 - 09.6 mg/dL    GFR calc non Af Amer >90  >90 mL/min    GFR calc Af Amer >90  >90 mL/min     RADIOLOGY RESULTS: No results found.  Problem List: Patient Active Problem List  Diagnosis  . Lapband Vagotomy July 2007    Assessment & Plan: Lapband port turned and inaccessible Revision of lapband port    Matt B. Daphine Deutscher, MD, [facility] [phone] beeper [phone]  09/04/2012 12:20 PM

## 2012-09-04 NOTE — Op Note (Signed)
Surgeon: Wenda Low, MD, FACS  Asst:  none  Anes:  Gen.  Procedure: Explantation of lap band port with replacement subcutaneously  Diagnosis: Turned port with cicatrix unable to access (10 cm lapband vagotomy study patient)  Complications: none  EBL:   minimal cc  Description of Procedure:  Patient was brought to room 1 and given general anesthesia. The abdomen was prepped with PCMX and draped sterilely. Timeout was performed. The old incision was incised transversely and I cut down to the port. This was encased in a very dense scar which and in a excising and then removing the sutures to remove it from the fascia. I then accessed with a needle and found there to be about a cc of fluid in there. I removed the old port by cutting it proximal to the joint and then placed a new port on that had been previously flushed and had Marlex mesh placed on the back. This was tucked into a subcutaneous pocket just at the lateral margin of the incision. The wound was then closed in layers with 4-0 Vicryl and with Dermabond. Patient are the procedure well and will be discharged home. And then of 1 cc was added back to the band which is a 10 cm lap band.she was taken to the recovery room in satisfactory condition  Matt B. Daphine Deutscher, MD, [facility], [place] [phone]

## 2012-09-04 NOTE — Transfer of Care (Signed)
Immediate Anesthesia Transfer of Care Note  Patient: Rachael White  Procedure(s) Performed: Procedure(s) (LRB) with comments: GASTRIC BANDING PORT REVISION (N/A) - Lap Band Port Revision  Patient Location: PACU  Anesthesia Type:General  Level of Consciousness: awake, sedated, pateint uncooperative, confused, lethargic and responds to stimulation  Airway & Oxygen Therapy: Patient Spontanous Breathing and Patient connected to face mask oxygen  Post-op Assessment: Report given to PACU RN, Post -op Vital signs reviewed and stable and Patient moving all extremities  Post vital signs: Reviewed and stable  Complications: No apparent anesthesia complications

## 2012-09-04 NOTE — Preoperative (Signed)
Beta Blockers   Reason not to administer Beta Blockers:Not Applicable 

## 2012-09-04 NOTE — Anesthesia Postprocedure Evaluation (Signed)
Anesthesia Post Note  Patient: Rachael White  Procedure(s) Performed: Procedure(s) (LRB): GASTRIC BANDING PORT REVISION (N/A)  Anesthesia type: General  Patient location: PACU  Post pain: Pain level controlled  Post assessment: Post-op Vital signs reviewed  Last Vitals: BP 150/83  Pulse 72  Temp 36.7 C  Resp 20  Wt 276 lb 6 oz (125.363 kg)  SpO2 99%  Post vital signs: Reviewed  Level of consciousness: sedated  Complications: No apparent anesthesia complications

## 2012-09-05 ENCOUNTER — Encounter (HOSPITAL_COMMUNITY): Payer: Self-pay | Admitting: Surgery

## 2012-09-21 ENCOUNTER — Ambulatory Visit (INDEPENDENT_AMBULATORY_CARE_PROVIDER_SITE_OTHER): Payer: Managed Care, Other (non HMO) | Admitting: Surgery

## 2012-09-21 ENCOUNTER — Encounter (INDEPENDENT_AMBULATORY_CARE_PROVIDER_SITE_OTHER): Payer: Self-pay | Admitting: Surgery

## 2012-09-21 VITALS — BP 140/88 | HR 98 | Temp 96.6°F | Resp 20 | Ht 66.5 in | Wt 279.2 lb

## 2012-09-21 DIAGNOSIS — Z9884 Bariatric surgery status: Secondary | ICD-10-CM

## 2012-09-21 NOTE — Patient Instructions (Signed)

## 2012-09-21 NOTE — Progress Notes (Signed)
Rachael White Rachael White Body mass index is 44.39 kg/(m^2).  Having regurgitation:  no  Nocturnal reflux?  no  Amount of fill  3 cc  Accessed fresh port laterally.  Able to swallow OK.  Will see back in 4 weeks

## 2012-09-23 ENCOUNTER — Encounter (HOSPITAL_COMMUNITY): Payer: Self-pay

## 2012-09-23 ENCOUNTER — Emergency Department (HOSPITAL_COMMUNITY)
Admission: EM | Admit: 2012-09-23 | Discharge: 2012-09-23 | Disposition: A | Discharge status: Home / Self Care | Payer: Managed Care, Other (non HMO)

## 2012-09-23 NOTE — ED Notes (Signed)
Dr. Ezzard Standing explained to patient when to f/u in a few weeks and patient verbalized understanding.

## 2012-09-23 NOTE — ED Notes (Signed)
Patient seen by Dr. Ezzard Standing from [facility].

## 2012-09-23 NOTE — ED Notes (Addendum)
Pt states lap band revision 2 weeks ago, yesterday started with reflux with any intake. Pt speaking without difficulty. Airway patent and clear. Dr Ezzard Standing contacted by patient and instructed to come to ED to be seen by Dr Ezzard Standing.

## 2012-09-23 NOTE — Consult Note (Addendum)
[facility]  Ovidio Kin, MD,  FACS [address] [place]  [address] [place], [place]    [address] Phone:  [phone] FAX:  [phone]   Re:   [name] DOB:   [birth year] MRN:   [id number]  [facility] visit  ASSESSMENT AND PLAN: 1.  Lap band, 10 cm and vagotomy - placed July 2007 by Dr. Rudell Cobb revised 09/04/2012 by Dr. Theodoro Clock to 3 cc on 09/21/2012  Presented with nausea and vomiting since fill, lap band too tight.  I removed 1.5 cc for approx 1.5 cc remaining in lap band  She is to keep appt with Dr. Daphine Deutscher in 1 month  2.  BMI - 44.39 at our office on 09/21/2012.  HISTORY OF PRESENT ILLNESS: Chief Complaint  Patient presents with  . Lap Band Fill    Rachael White is a 47 y.o. (DOB: 1965-10-21)  AA female who is a patient of SANDERS,ROBYN N, MD and comes to [facility] for nausea and vomiting after lap band fill Friday, 09/21/2012.  She had a port revised by Dr. Daphine Deutscher on 09/04/2012.  She thinks she has about 3 cc in port (this is what is recorded in Epic).  PHYSICAL EXAM: BP 135/82  Pulse 87  Temp 98.2 F (36.8 C) (Oral)  Resp 20  Ht 5' 6.5" (1.689 m)  Wt 279 lb (126.554 kg)  BMI 44.36 kg/m2  SpO2 99%  LMP 08/12/2012  Abdomen:  Port a little hard to palpate.  Abdomen otherwise soft.  Procedure:  I accessed her port with huber needle.  I removed 1.5 cc and she had instant relief.  She took some ice water, which she tolerated.  DATA REVIEWED: Epic notes.  Ovidio Kin, MD, FACS Office:  [phone]

## 2012-09-24 ENCOUNTER — Encounter (INDEPENDENT_AMBULATORY_CARE_PROVIDER_SITE_OTHER): Payer: Self-pay

## 2012-10-19 ENCOUNTER — Ambulatory Visit (INDEPENDENT_AMBULATORY_CARE_PROVIDER_SITE_OTHER): Payer: Managed Care, Other (non HMO) | Admitting: Surgery

## 2012-10-19 VITALS — BP 138/90 | HR 74 | Resp 20 | Ht 66.5 in | Wt 280.2 lb

## 2012-10-19 DIAGNOSIS — Z9884 Bariatric surgery status: Secondary | ICD-10-CM

## 2012-10-19 NOTE — Progress Notes (Signed)
Lapband Fill Encounter Problem List:   Patient Active Problem List  Diagnosis  . Lapband Vagotomy + Hiatus Hernia Repair July 2007    Rachael White Body mass index is 44.55 kg/(m^2).  Having regurgitation?:  no  Feel that they need a fill?  yes  Nocturnal reflux?  no  Amount of fill  .75 cc   Port site: Accessed easily.  Dr. Ezzard Standing had removed 1.5 cc from the 3 cc that I had placed.  I therefore added 0.75 to bring her to a theoretical 2.25.  I will see her back in about a month.  She is having no reflux and the hiatal hernia repair cured her reflux  Instructions given and weight loss goals discussed.    Matt B. Daphine Deutscher, MD, FACS

## 2012-10-19 NOTE — Patient Instructions (Signed)

## 2012-11-22 ENCOUNTER — Encounter (INDEPENDENT_AMBULATORY_CARE_PROVIDER_SITE_OTHER): Payer: Self-pay | Admitting: Surgery

## 2012-11-22 ENCOUNTER — Ambulatory Visit (INDEPENDENT_AMBULATORY_CARE_PROVIDER_SITE_OTHER): Payer: Managed Care, Other (non HMO) | Admitting: Surgery

## 2012-11-22 ENCOUNTER — Telehealth (INDEPENDENT_AMBULATORY_CARE_PROVIDER_SITE_OTHER): Payer: Self-pay

## 2012-11-22 VITALS — BP 128/87 | HR 76 | Temp 97.6°F | Resp 14 | Ht 66.5 in | Wt 276.0 lb

## 2012-11-22 DIAGNOSIS — Z9884 Bariatric surgery status: Secondary | ICD-10-CM

## 2012-11-22 NOTE — Telephone Encounter (Signed)
Called pt to see if she would like to come in today at 1030a for a LBF, since I had a cancellation.  I also let her know that the next available appt would be May 23rd.  Pt agreed to come in today.

## 2012-11-22 NOTE — Progress Notes (Signed)
Lapband Fill Encounter Problem List:   Patient Active Problem List  Diagnosis  . Lapband Vagotomy + Hiatus Hernia Repair July 2007    Rachael White Body mass index is 43.89 kg/(m^2). Weight loss since surgery  18    Having regurgitation?:  no  Feel that they need a fill?  yes  Nocturnal reflux?  no  Amount of fill  3   Port site: I accessed easily.  Initially I put in .3 cc with tuberculin syringe.  I then checked to see how much was in the port and she had a little over 2.  She may have a leaking port.  I did a dynamic fill and I want to see how she does.  If she gets less restricted in the next week then we will have to revise her port.     Instructions given and weight loss goals discussed.    Matt B. Daphine Deutscher, MD, FACS

## 2012-11-22 NOTE — Patient Instructions (Addendum)

## 2013-01-10 ENCOUNTER — Encounter (INDEPENDENT_AMBULATORY_CARE_PROVIDER_SITE_OTHER): Payer: Self-pay | Admitting: Surgery

## 2013-01-10 ENCOUNTER — Ambulatory Visit (INDEPENDENT_AMBULATORY_CARE_PROVIDER_SITE_OTHER): Payer: Managed Care, Other (non HMO) | Admitting: Surgery

## 2013-01-10 VITALS — BP 128/76 | HR 68 | Temp 98.2°F | Resp 18 | Ht 66.5 in | Wt 274.4 lb

## 2013-01-10 DIAGNOSIS — Z9884 Bariatric surgery status: Secondary | ICD-10-CM

## 2013-01-10 NOTE — Progress Notes (Signed)
Lapband Fill Encounter Problem List:   Patient Active Problem List   Diagnosis Date Noted  . Lapband Vagotomy + Hiatus Hernia Repair July 2007 06/13/2012    Rachael White Body mass index is 43.63 kg/(m^2). Weight loss since surgery  15   Having regurgitation?:  no  Feel that they need a fill?  yes  Nocturnal reflux?  no  Amount of fill  0.25   Port site: Had accessed with some difficulty.  Was tight early on.  May have slow leak.  If she calls and is relatively unrestricted will move toward schduling port exchange.   Instructions given and weight loss goals discussed.    Matt B. Daphine Deutscher, MD, FACS

## 2013-01-18 ENCOUNTER — Encounter (INDEPENDENT_AMBULATORY_CARE_PROVIDER_SITE_OTHER): Payer: Self-pay | Admitting: Surgery

## 2013-01-18 ENCOUNTER — Ambulatory Visit (INDEPENDENT_AMBULATORY_CARE_PROVIDER_SITE_OTHER): Payer: Managed Care, Other (non HMO) | Admitting: Surgery

## 2013-01-18 VITALS — BP 148/90 | HR 80 | Temp 97.7°F | Resp 16 | Ht 66.5 in | Wt 269.2 lb

## 2013-01-18 DIAGNOSIS — Z9884 Bariatric surgery status: Secondary | ICD-10-CM

## 2013-01-18 NOTE — Patient Instructions (Addendum)
Watch your salt intake °

## 2013-01-18 NOTE — Progress Notes (Signed)
Weight is down by about 5 pounds s.nce I saw her about a week ago. She is noticing more stricture in the morning. We talked about salt intake and fluid retention. We're going to continue to follow her and see if this level of restriction as can be adequate. I'll see her again in 4 weeks

## 2013-02-22 ENCOUNTER — Ambulatory Visit (INDEPENDENT_AMBULATORY_CARE_PROVIDER_SITE_OTHER): Payer: Managed Care, Other (non HMO) | Admitting: Surgery

## 2013-02-22 ENCOUNTER — Encounter (INDEPENDENT_AMBULATORY_CARE_PROVIDER_SITE_OTHER): Payer: Self-pay | Admitting: Surgery

## 2013-02-22 VITALS — BP 140/98 | HR 88 | Temp 98.0°F | Resp 17 | Ht 66.5 in | Wt 271.0 lb

## 2013-02-22 DIAGNOSIS — Z9884 Bariatric surgery status: Secondary | ICD-10-CM

## 2013-02-22 NOTE — Progress Notes (Signed)
Rachael White indicated that after her last fill she she was full for about a week and then fell nor stricturing. I changed her port out earlier this year. I accessed her port today and found only 2 cc of fluid. She should have had a little over 3 cc of fluid.  It appears that her lap band must be leaking. I think at this point we would recommend replacing her lap band with an AP standard. Will turn the chart over to Leandrew Koyanagi to seek approval for replacement of her lap band. We reran we discussed some of the other options but these are more invasive and she is not interested in this. She indicated she would like to have her lap band were placed if possible.  Plan would replace lap band 10 cm with lap band APS.

## 2013-02-22 NOTE — Patient Instructions (Signed)

## 2013-02-25 ENCOUNTER — Ambulatory Visit (INDEPENDENT_AMBULATORY_CARE_PROVIDER_SITE_OTHER): Payer: Managed Care, Other (non HMO) | Admitting: Surgery

## 2013-02-25 ENCOUNTER — Telehealth (INDEPENDENT_AMBULATORY_CARE_PROVIDER_SITE_OTHER): Payer: Self-pay | Admitting: General Surgery

## 2013-02-25 ENCOUNTER — Encounter (INDEPENDENT_AMBULATORY_CARE_PROVIDER_SITE_OTHER): Payer: Self-pay | Admitting: Surgery

## 2013-02-25 VITALS — BP 142/90 | HR 76 | Temp 98.0°F | Resp 18 | Ht 66.5 in | Wt 257.8 lb

## 2013-02-25 DIAGNOSIS — Z9884 Bariatric surgery status: Secondary | ICD-10-CM

## 2013-02-25 NOTE — Telephone Encounter (Signed)
Patient calling after lap band fill on Friday 02/22/2013. She is calling today stating she can not burp and is feeling uncomfortable. She is keeping down food and liquids but states she is very uncomfortable. I put her on the scheduled to see Dr Daphine Deutscher on Wednesday but she would really like to be seen prior to that if possible. Please advise. [phone].

## 2013-02-25 NOTE — Progress Notes (Signed)
Subjective:     Patient ID: Rachael White, female   DOB: [birth year], 47 y.o.   MRN: [id number]  HPI She was here today because she felt like her band was too tight and she could only keep down minimal liquids  Review of Systems     Objective:   Physical Exam I prepped the site with alcohol and inserted the spinal needle and drained 1.5 cc of saline. She reported initially feeling better    Assessment:     Patient status post removal of fluid from LAP-BAND     Plan:     She will call back Dr. Daphine Deutscher as needed

## 2013-02-27 ENCOUNTER — Encounter (INDEPENDENT_AMBULATORY_CARE_PROVIDER_SITE_OTHER): Payer: Managed Care, Other (non HMO) | Admitting: Surgery

## 2013-03-19 ENCOUNTER — Telehealth (INDEPENDENT_AMBULATORY_CARE_PROVIDER_SITE_OTHER): Payer: Self-pay | Admitting: Surgery

## 2013-03-19 NOTE — Telephone Encounter (Signed)
Pt approved for revision lap band to [facility]NY[facility] please write orders and place in EPIC  Thanks

## 2013-03-26 ENCOUNTER — Telehealth (INDEPENDENT_AMBULATORY_CARE_PROVIDER_SITE_OTHER): Payer: Self-pay | Admitting: Surgery

## 2013-03-26 NOTE — Telephone Encounter (Signed)
Please create orders for this patients bariatric procedure

## 2013-04-01 ENCOUNTER — Other Ambulatory Visit (INDEPENDENT_AMBULATORY_CARE_PROVIDER_SITE_OTHER): Payer: Self-pay | Admitting: Surgery

## 2013-04-12 ENCOUNTER — Ambulatory Visit (INDEPENDENT_AMBULATORY_CARE_PROVIDER_SITE_OTHER): Payer: Managed Care, Other (non HMO) | Admitting: Surgery

## 2013-04-12 ENCOUNTER — Other Ambulatory Visit (INDEPENDENT_AMBULATORY_CARE_PROVIDER_SITE_OTHER): Payer: Self-pay | Admitting: Surgery

## 2013-04-12 VITALS — BP 140/86 | HR 74 | Resp 16 | Ht 66.5 in | Wt 269.8 lb

## 2013-04-12 DIAGNOSIS — Z9884 Bariatric surgery status: Secondary | ICD-10-CM

## 2013-04-12 NOTE — Progress Notes (Signed)
Chief Complaint:  Probable leaking 10 cm lap band  History of Present Illness:  Rachael White is an 47 y.o. female Who underwent a lap band vagotomy with placement of a 10 cm band July 2007. Her preop weight was 294.2 and she lost a maximum of about 35-40 pounds. We felt that she likely has had a leaking band and graft tested on occasions. We plan to go back on September 2 and replace her 10 cm band with a APS system band.  Past Medical History  Diagnosis Date  . Anxiety   . Depression     depression only    Past Surgical History  Procedure Laterality Date  . Laparoscopic gastric banding  With vagotomy July 2007    Lapband vagotomy study patient  . Gastric banding port revision  09/04/2012    Procedure: GASTRIC BANDING PORT REVISION;  Surgeon: Valarie Merino, MD;  Location: [facility];  Service: General;  Laterality: N/A;  Lap Band Port Revision  . Breast reduction surgery    . Knee arthroscopy      bilaterial  . Wrist ganglion excision      Current Outpatient Prescriptions  Medication Sig Dispense Refill  . aluminum & magnesium hydroxide-simethicone (MYLANTA) 500-450-40 MG/5ML suspension Take 15 mLs by mouth every 6 (six) hours as needed. For stomach      . amphetamine-dextroamphetamine (ADDERALL XR) 25 MG 24 hr capsule Take 25 mg by mouth daily before breakfast.       . diazepam (VALIUM) 5 MG tablet Take 5 mg by mouth every 8 (eight) hours as needed. Anxiety      . Multiple Vitamin (MULTIVITAMIN) tablet Take 1 tablet by mouth daily.       No current facility-administered medications for this visit.   Penicillins Family History  Problem Relation Age of Onset  . Cancer Father     prostate   Social History:   reports that she has never smoked. She does not have any smokeless tobacco history on file. She reports that she drinks about 0.6 ounces of alcohol per week. She reports that she does not use illicit drugs.   REVIEW OF SYSTEMS - PERTINENT POSITIVES ONLY: Negative for  appetite. No history of DVT. Other systems are negative.  Physical Exam:   Blood pressure 140/86, pulse 74, resp. rate 16, height 5' 6.5" (1.689 m), weight 269 lb 12.8 oz (122.38 kg). Body mass index is 42.9 kg/(m^2).  Gen:  WDWN African American female NAD  Neurological: Alert and oriented to person, place, and time. Motor and sensory function is grossly intact  Head: Normocephalic and atraumatic.  Eyes: Conjunctivae are normal. Pupils are equal, round, and reactive to light. No scleral icterus.  Neck: Normal range of motion. Neck supple. No tracheal deviation or thyromegaly present.  Cardiovascular:  SR without murmurs or gallops.  No carotid bruits Respiratory: Effort normal.  No respiratory distress. No chest wall tenderness. Breath sounds normal.  No wheezes, rales or rhonchi.  Abdomen:  Nontender GU: Musculoskeletal: Normal range of motion. Extremities are nontender. No cyanosis, edema or clubbing noted Lymphadenopathy: No cervical, preauricular, postauricular or axillary adenopathy is present Skin: Skin is warm and dry. No rash noted. No diaphoresis. No erythema. No pallor. Pscyh: Normal mood and affect. Behavior is normal. Judgment and thought content normal.   LABORATORY RESULTS: No results found for this or any previous visit (from the past 48 hour(s)).  RADIOLOGY RESULTS: No results found.  Problem List: Patient Active Problem List  Diagnosis Date Noted  . Lapband Vagotomy + Hiatus Hernia Repair July 2007 06/13/2012    Assessment & Plan: Removal of 10 cm band with replacement with a APS band.    Matt B. Daphine Deutscher, MD, [facility] [phone] beeper [phone]  04/12/2013 4:48 PM

## 2013-04-12 NOTE — Patient Instructions (Signed)

## 2013-04-30 ENCOUNTER — Encounter (HOSPITAL_COMMUNITY): Admission: RE | Payer: Self-pay | Source: Ambulatory Visit

## 2013-04-30 ENCOUNTER — Inpatient Hospital Stay (HOSPITAL_COMMUNITY): Admission: RE | Admit: 2013-04-30 | Payer: Managed Care, Other (non HMO) | Source: Ambulatory Visit | Admitting: Surgery

## 2013-04-30 SURGERY — GASTRECTOMY, SLEEVE, LAPAROSCOPIC
Anesthesia: General

## 2013-05-08 ENCOUNTER — Encounter (INDEPENDENT_AMBULATORY_CARE_PROVIDER_SITE_OTHER): Payer: Managed Care, Other (non HMO) | Admitting: Surgery

## 2013-05-20 ENCOUNTER — Encounter (HOSPITAL_COMMUNITY): Payer: Self-pay | Admitting: Pharmacy Technician

## 2013-05-24 ENCOUNTER — Encounter (HOSPITAL_COMMUNITY)
Admission: RE | Admit: 2013-05-24 | Discharge: 2013-05-24 | Disposition: A | Discharge status: Home / Self Care | Payer: Managed Care, Other (non HMO) | Source: Ambulatory Visit | Attending: Surgery | Admitting: Surgery

## 2013-05-24 ENCOUNTER — Encounter (HOSPITAL_COMMUNITY): Payer: Self-pay

## 2013-05-24 DIAGNOSIS — Z01818 Encounter for other preprocedural examination: Secondary | ICD-10-CM | POA: Insufficient documentation

## 2013-05-24 DIAGNOSIS — Z01812 Encounter for preprocedural laboratory examination: Secondary | ICD-10-CM | POA: Insufficient documentation

## 2013-05-24 LAB — COMPREHENSIVE METABOLIC PANEL
AST: 21 U/L (ref 0–37)
Albumin: 3.4 g/dL — ABNORMAL LOW (ref 3.5–5.2)
Alkaline Phosphatase: 51 U/L (ref 39–117)
BUN: 6 mg/dL (ref 6–23)
CO2: 26 mEq/L (ref 19–32)
Chloride: 102 mEq/L (ref 96–112)
GFR calc non Af Amer: >90 mL/min (ref >90)
Potassium: 3.4 mEq/L — ABNORMAL LOW (ref 3.5–5.1)
Total Bilirubin: 0.3 mg/dL (ref 0.3–1.2)

## 2013-05-24 LAB — CBC WITH DIFFERENTIAL/PLATELET
Basophils Absolute: 0 10*3/uL (ref 0.0–0.1)
Basophils Relative: 0 % (ref 0–1)
HCT: 33.7 % — ABNORMAL LOW (ref 36.0–46.0)
Hemoglobin: 11.2 g/dL — ABNORMAL LOW (ref 12.0–15.0)
Lymphocytes Relative: 49 % — ABNORMAL HIGH (ref 12–46)
MCHC: 33.2 g/dL (ref 30.0–36.0)
Monocytes Relative: 6 % (ref 3–12)
Neutro Abs: 2.6 10*3/uL (ref 1.7–7.7)
Neutrophils Relative %: 42 % — ABNORMAL LOW (ref 43–77)
RBC: 4.08 MIL/uL (ref 3.87–5.11)
WBC: 6.3 10*3/uL (ref 4.0–10.5)

## 2013-05-24 NOTE — Patient Instructions (Addendum)
20 Rachael White  05/24/2013   Your procedure is scheduled on:   05-28-2013  Report to [facility] at     0515   AM /.  Call this number if you have problems the morning of surgery: [phone]  Or Presurgical Testing [phone](Kellen Hover)   Remember: Follow any bowel prep instructions per MD office.    Do not eat food:After Midnight.    Take these medicines the morning of surgery with A SIP OF WATER: Valium if needed   Do not wear jewelry, make-up or nail polish.  Do not wear lotions, powders, or perfumes. You may wear deodorant.  Do not shave 12 hours prior to first CHG shower(legs and under arms).(face and neck okay.)  Do not bring valuables to the hospital.  Contacts, dentures or bridgework,body piercing,  may not be worn into surgery.  Leave suitcase in the car. After surgery it may be brought to your room.  For patients admitted to the hospital, checkout time is 11:00 AM the day of discharge.   Patients discharged the day of surgery will not be allowed to drive home. Must have responsible person with you x 24 hours once discharged.  Name and phone number of your driver: [name] [phone] cell  Special Instructions: CHG(Chlorhedine 4%-"Hibiclens","Betasept","Aplicare") Shower Use Special Wash: see special instructions.(avoid face and genitals)   Please read over the following fact sheets that you were given: Incentive Spirometry Instruction.    Failure to follow these instructions may result in Cancellation of your surgery.   Patient signature_______________________________________________________

## 2013-05-24 NOTE — Progress Notes (Signed)
05-24-13 1450 Patient has conflicting orders-will sign consent on arrival after talking with MD- please discontinue any orders not pertinent for planned procedure. Thanks. W. Kennon Portela

## 2013-05-27 ENCOUNTER — Telehealth (INDEPENDENT_AMBULATORY_CARE_PROVIDER_SITE_OTHER): Payer: Self-pay

## 2013-05-27 ENCOUNTER — Other Ambulatory Visit (INDEPENDENT_AMBULATORY_CARE_PROVIDER_SITE_OTHER): Payer: Self-pay | Admitting: Surgery

## 2013-05-27 NOTE — Telephone Encounter (Signed)
Pt called to verify any prep instructions prior to tomorrows surgery. Reviewed orders in epic and spoke with Meagen. Only fleets enema noted in orders and no colon prep for lap band per Meagen. Pt advised.

## 2013-05-27 NOTE — Progress Notes (Signed)
Paged Dr Daphine Deutscher explaining that there are two sets of orders with two different consents. He states he will call patient tonight to verify what procedure she desires and will enter orders. RN explains that there will now be 3 sets of orders. He states he cannot find orders that he has entered and that he cannot see what orders are signed and held. RN states that she can discontinue all signed and held orders so that he may enter all new orders for early morning case. He asks that RN do this.

## 2013-05-28 ENCOUNTER — Encounter (HOSPITAL_COMMUNITY): Payer: Self-pay | Admitting: *Deleted

## 2013-05-28 ENCOUNTER — Ambulatory Visit (HOSPITAL_COMMUNITY): Payer: Managed Care, Other (non HMO)

## 2013-05-28 ENCOUNTER — Ambulatory Visit (HOSPITAL_COMMUNITY)
Admission: RE | Admit: 2013-05-28 | Discharge: 2013-05-28 | Disposition: A | Discharge status: Home / Self Care | Payer: Managed Care, Other (non HMO) | Source: Ambulatory Visit | Attending: Surgery | Admitting: Surgery

## 2013-05-28 ENCOUNTER — Encounter (HOSPITAL_COMMUNITY): Payer: Self-pay | Admitting: Anesthesiology

## 2013-05-28 ENCOUNTER — Encounter (HOSPITAL_COMMUNITY)
Admission: RE | Disposition: A | Discharge status: Home / Self Care | Payer: Self-pay | Source: Ambulatory Visit | Attending: Surgery

## 2013-05-28 ENCOUNTER — Ambulatory Visit (HOSPITAL_COMMUNITY): Payer: Managed Care, Other (non HMO) | Admitting: Anesthesiology

## 2013-05-28 DIAGNOSIS — Z6841 Body Mass Index (BMI) 40.0 and over, adult: Secondary | ICD-10-CM

## 2013-05-28 DIAGNOSIS — K9509 Other complications of gastric band procedure: Secondary | ICD-10-CM | POA: Insufficient documentation

## 2013-05-28 DIAGNOSIS — T85898A Other specified complication of other internal prosthetic devices, implants and grafts, initial encounter: Secondary | ICD-10-CM

## 2013-05-28 DIAGNOSIS — Y831 Surgical operation with implant of artificial internal device as the cause of abnormal reaction of the patient, or of later complication, without mention of misadventure at the time of the procedure: Secondary | ICD-10-CM | POA: Insufficient documentation

## 2013-05-28 HISTORY — PX: LAPAROSCOPIC REVISION OF GASTRIC BAND: SHX5921

## 2013-05-28 SURGERY — REVISION, GASTRIC BAND, LAPAROSCOPIC
Anesthesia: General | Site: Abdomen | Wound class: Clean

## 2013-05-28 MED ORDER — KETOROLAC TROMETHAMINE 30 MG/ML IJ SOLN
15.0000 mg | Freq: Once | INTRAMUSCULAR | Status: AC | PRN
  Administered 2013-05-28: 30 mg via INTRAVENOUS
  Filled 2013-05-28: qty 1

## 2013-05-28 MED ORDER — PROMETHAZINE HCL 25 MG/ML IJ SOLN
6.2500 mg | INTRAMUSCULAR | Status: DC | PRN
  Administered 2013-05-28: 12.5 mg via INTRAVENOUS

## 2013-05-28 MED ORDER — ACETAMINOPHEN 325 MG PO TABS: 650.0000 mg | ORAL_TABLET | ORAL | Status: DC | PRN

## 2013-05-28 MED ORDER — HYDROCODONE-ACETAMINOPHEN 5-325 MG PO TABS: 1.0000 | ORAL_TABLET | ORAL | Status: DC | PRN

## 2013-05-28 MED ORDER — EPHEDRINE SULFATE 50 MG/ML IJ SOLN
INTRAMUSCULAR | Status: DC | PRN
  Administered 2013-05-28: 7.5 mg via INTRAVENOUS

## 2013-05-28 MED ORDER — PHENYLEPHRINE HCL 10 MG/ML IJ SOLN
INTRAMUSCULAR | Status: DC | PRN
  Administered 2013-05-28: 80 ug via INTRAVENOUS

## 2013-05-28 MED ORDER — CISATRACURIUM BESYLATE (PF) 10 MG/5ML IV SOLN
INTRAVENOUS | Status: DC | PRN
  Administered 2013-05-28: 2 mg via INTRAVENOUS
  Administered 2013-05-28: 10 mg via INTRAVENOUS
  Administered 2013-05-28: 2 mg via INTRAVENOUS

## 2013-05-28 MED ORDER — HYDROMORPHONE HCL PF 1 MG/ML IJ SOLN: 0.2500 mg | INTRAMUSCULAR | Status: DC | PRN

## 2013-05-28 MED ORDER — PROMETHAZINE HCL 25 MG/ML IJ SOLN
INTRAMUSCULAR | Status: DC
Start: 2013-05-28 — End: 2013-05-28
  Filled 2013-05-28: qty 1

## 2013-05-28 MED ORDER — OXYCODONE HCL 5 MG PO TABS: 5.0000 mg | ORAL_TABLET | ORAL | Status: DC | PRN

## 2013-05-28 MED ORDER — SODIUM CHLORIDE 0.9 % IJ SOLN: 3.0000 mL | INTRAMUSCULAR | Status: DC | PRN

## 2013-05-28 MED ORDER — SODIUM CHLORIDE 0.9 % IV SOLN: 250.0000 mL | INTRAVENOUS | Status: DC | PRN

## 2013-05-28 MED ORDER — HEPARIN SODIUM (PORCINE) 5000 UNIT/ML IJ SOLN
5000.0000 [IU] | INTRAMUSCULAR | Status: AC
  Administered 2013-05-28: 5000 [IU] via SUBCUTANEOUS
  Filled 2013-05-28: qty 1

## 2013-05-28 MED ORDER — SODIUM CHLORIDE 0.9 % IJ SOLN: 3.0000 mL | Freq: Two times a day (BID) | INTRAMUSCULAR | Status: DC

## 2013-05-28 MED ORDER — SODIUM CHLORIDE 0.9 % IJ SOLN
INTRAMUSCULAR | Status: AC
  Filled 2013-05-28: qty 20

## 2013-05-28 MED ORDER — LACTATED RINGERS IV SOLN: INTRAVENOUS | Status: DC

## 2013-05-28 MED ORDER — DEXAMETHASONE SODIUM PHOSPHATE 10 MG/ML IJ SOLN
INTRAMUSCULAR | Status: DC | PRN
  Administered 2013-05-28: 10 mg via INTRAVENOUS

## 2013-05-28 MED ORDER — MORPHINE SULFATE 10 MG/ML IJ SOLN: 1.0000 mg | INTRAMUSCULAR | Status: DC | PRN

## 2013-05-28 MED ORDER — CEFOXITIN SODIUM 2 G IV SOLR
2.0000 g | INTRAVENOUS | Status: AC
  Administered 2013-05-28: 2 g via INTRAVENOUS

## 2013-05-28 MED ORDER — SODIUM CHLORIDE 0.9 % IJ SOLN
INTRAMUSCULAR | Status: DC | PRN
  Administered 2013-05-28: 20 mL

## 2013-05-28 MED ORDER — BUPIVACAINE LIPOSOME 1.3 % IJ SUSP
20.0000 mL | Freq: Once | INTRAMUSCULAR | Status: DC
  Filled 2013-05-28: qty 20

## 2013-05-28 MED ORDER — FENTANYL CITRATE 0.05 MG/ML IJ SOLN
INTRAMUSCULAR | Status: DC | PRN
  Administered 2013-05-28: 50 ug via INTRAVENOUS
  Administered 2013-05-28: 100 ug via INTRAVENOUS
  Administered 2013-05-28: 50 ug via INTRAVENOUS

## 2013-05-28 MED ORDER — GLYCOPYRROLATE 0.2 MG/ML IJ SOLN
INTRAMUSCULAR | Status: DC | PRN
  Administered 2013-05-28: .8 mg via INTRAVENOUS

## 2013-05-28 MED ORDER — MIDAZOLAM HCL 5 MG/5ML IJ SOLN
INTRAMUSCULAR | Status: DC | PRN
  Administered 2013-05-28: 2 mg via INTRAVENOUS

## 2013-05-28 MED ORDER — BUPIVACAINE LIPOSOME 1.3 % IJ SUSP
INTRAMUSCULAR | Status: DC | PRN
  Administered 2013-05-28: 20 mL

## 2013-05-28 MED ORDER — LACTATED RINGERS IV SOLN
INTRAVENOUS | Status: DC | PRN
  Administered 2013-05-28: 07:00:00 via INTRAVENOUS

## 2013-05-28 MED ORDER — ONDANSETRON HCL 4 MG/2ML IJ SOLN
4.0000 mg | Freq: Four times a day (QID) | INTRAMUSCULAR | Status: DC | PRN
  Administered 2013-05-28: 4 mg via INTRAVENOUS
  Filled 2013-05-28: qty 2

## 2013-05-28 MED ORDER — ONDANSETRON HCL 4 MG/2ML IJ SOLN
INTRAMUSCULAR | Status: DC | PRN
  Administered 2013-05-28: 4 mg via INTRAVENOUS

## 2013-05-28 MED ORDER — PROPOFOL 10 MG/ML IV BOLUS
INTRAVENOUS | Status: DC | PRN
  Administered 2013-05-28: 200 mg via INTRAVENOUS

## 2013-05-28 MED ORDER — DEXTROSE 5 % IV SOLN
INTRAVENOUS | Status: AC
  Filled 2013-05-28 (×2): qty 1

## 2013-05-28 MED ORDER — NEOSTIGMINE METHYLSULFATE 1 MG/ML IJ SOLN
INTRAMUSCULAR | Status: DC | PRN
  Administered 2013-05-28: 5 mg via INTRAVENOUS

## 2013-05-28 MED ORDER — ACETAMINOPHEN 650 MG RE SUPP
650.0000 mg | RECTAL | Status: DC | PRN
  Filled 2013-05-28: qty 1

## 2013-05-28 SURGICAL SUPPLY — 82 items
APPLICATOR COTTON TIP 6IN STRL (MISCELLANEOUS) ×3 IMPLANT
APPLIER CLIP ROT 10 11.4 M/L (STAPLE)
BAND LAP 10.0 W/TUBES (Band) ×3 IMPLANT
BENZOIN TINCTURE PRP APPL 2/3 (GAUZE/BANDAGES/DRESSINGS) IMPLANT
BLADE HEX COATED 2.75 (ELECTRODE) ×3 IMPLANT
BLADE SURG 15 STRL LF DISP TIS (BLADE) ×2 IMPLANT
BLADE SURG 15 STRL SS (BLADE) ×1
CABLE HIGH FREQUENCY MONO STRZ (ELECTRODE) ×3 IMPLANT
CANISTER SUCTION 2500CC (MISCELLANEOUS) ×6 IMPLANT
CLIP APPLIE ROT 10 11.4 M/L (STAPLE) IMPLANT
CLOTH BEACON ORANGE TIMEOUT ST (SAFETY) ×3 IMPLANT
DECANTER SPIKE VIAL GLASS SM (MISCELLANEOUS) ×18 IMPLANT
DERMABOND ADVANCED (GAUZE/BANDAGES/DRESSINGS) ×2
DERMABOND ADVANCED .7 DNX12 (GAUZE/BANDAGES/DRESSINGS) ×4 IMPLANT
DEVICE SUT QUICK LOAD TK 5 (STAPLE) ×9 IMPLANT
DEVICE SUT TI-KNOT TK 5X26 (MISCELLANEOUS) ×3 IMPLANT
DEVICE SUTURE ENDOST 10MM (ENDOMECHANICALS) IMPLANT
DEVICE TROCAR PUNCTURE CLOSURE (ENDOMECHANICALS) IMPLANT
DISSECTOR BLUNT TIP ENDO 5MM (MISCELLANEOUS) IMPLANT
DRAIN CHANNEL 19F RND (DRAIN) IMPLANT
DRAPE CAMERA CLOSED 9X96 (DRAPES) ×6 IMPLANT
ELECT REM PT RETURN 9FT ADLT (ELECTROSURGICAL) ×6
ELECTRODE REM PT RTRN 9FT ADLT (ELECTROSURGICAL) ×4 IMPLANT
EVACUATOR DRAINAGE 10X20 100CC (DRAIN) ×2 IMPLANT
EVACUATOR SILICONE 100CC (DRAIN) ×1
GLOVE BIO SURGEON STRL SZ8 (GLOVE) ×3 IMPLANT
GLOVE BIOGEL M 8.0 STRL (GLOVE) ×3 IMPLANT
GOWN STRL REIN XL XLG (GOWN DISPOSABLE) ×24 IMPLANT
HOVERMATT SINGLE USE (MISCELLANEOUS) ×6 IMPLANT
KIT BASIN OR (CUSTOM PROCEDURE TRAY) ×6 IMPLANT
MARKER SKIN DUAL TIP RULER LAB (MISCELLANEOUS) IMPLANT
MESH HERNIA 1X4 RECT BARD (Mesh General) ×2 IMPLANT
MESH HERNIA BARD 1X4 (Mesh General) ×1 IMPLANT
NEEDLE SPNL 22GX3.5 QUINCKE BK (NEEDLE) ×6 IMPLANT
NS IRRIG 1000ML POUR BTL (IV SOLUTION) ×6 IMPLANT
PACK UNIVERSAL I (CUSTOM PROCEDURE TRAY) ×6 IMPLANT
PENCIL BUTTON HOLSTER BLD 10FT (ELECTRODE) ×6 IMPLANT
POUCH SPECIMEN RETRIEVAL 10MM (ENDOMECHANICALS) IMPLANT
RELOAD BLUE (STAPLE) IMPLANT
RELOAD ENDO STITCH (ENDOMECHANICALS) IMPLANT
RELOAD GREEN (STAPLE) IMPLANT
SCALPEL HARMONIC ACE (MISCELLANEOUS) IMPLANT
SCISSORS LAP 5X45 EPIX DISP (ENDOMECHANICALS) ×3 IMPLANT
SEALANT SURGICAL APPL DUAL CAN (MISCELLANEOUS) IMPLANT
SET IRRIG TUBING LAPAROSCOPIC (IRRIGATION / IRRIGATOR) ×3 IMPLANT
SHEARS CURVED HARMONIC AC 45CM (MISCELLANEOUS) IMPLANT
SLEEVE ADV FIXATION 5X100MM (TROCAR) IMPLANT
SLEEVE XCEL OPT CAN 5 100 (ENDOMECHANICALS) ×15 IMPLANT
SOLUTION ANTI FOG 6CC (MISCELLANEOUS) ×6 IMPLANT
SPONGE GAUZE 4X4 12PLY (GAUZE/BANDAGES/DRESSINGS) ×3 IMPLANT
SPONGE LAP 18X18 X RAY DECT (DISPOSABLE) ×3 IMPLANT
STAPLE ECHEON FLEX 60 POW ENDO (STAPLE) IMPLANT
STAPLER VISISTAT 35W (STAPLE) ×3 IMPLANT
STRIP CLOSURE SKIN 1/2X4 (GAUZE/BANDAGES/DRESSINGS) IMPLANT
SUT ETHIBOND 2 0 SH (SUTURE) ×3
SUT ETHIBOND 2 0 SH 36X2 (SUTURE) ×6 IMPLANT
SUT ETHILON 2 0 PS N (SUTURE) IMPLANT
SUT PROLENE 2 0 CT2 30 (SUTURE) ×3 IMPLANT
SUT SILK 0 (SUTURE) ×1
SUT SILK 0 30XBRD TIE 6 (SUTURE) ×2 IMPLANT
SUT SURGIDAC NAB ES-9 0 48 120 (SUTURE) IMPLANT
SUT VIC AB 2-0 SH 27 (SUTURE)
SUT VIC AB 2-0 SH 27X BRD (SUTURE) IMPLANT
SUT VIC AB 4-0 SH 18 (SUTURE) ×9 IMPLANT
SYR 20CC LL (SYRINGE) ×6 IMPLANT
SYR 50ML LL SCALE MARK (SYRINGE) ×3 IMPLANT
TOWEL OR 17X26 10 PK STRL BLUE (TOWEL DISPOSABLE) ×6 IMPLANT
TOWEL OR NON WOVEN STRL DISP B (DISPOSABLE) ×3 IMPLANT
TRAY FOLEY CATH 14FRSI W/METER (CATHETERS) IMPLANT
TRAY LAP CHOLE (CUSTOM PROCEDURE TRAY) ×3 IMPLANT
TROCAR ADV FIXATION 11X100MM (TROCAR) IMPLANT
TROCAR BLADELESS 15MM (ENDOMECHANICALS) ×6 IMPLANT
TROCAR BLADELESS OPT 5 100 (ENDOMECHANICALS) ×3 IMPLANT
TROCAR XCEL 12X100 BLDLESS (ENDOMECHANICALS) IMPLANT
TROCAR XCEL NON-BLD 11X100MML (ENDOMECHANICALS) ×3 IMPLANT
TROCAR XCEL NON-BLD 5MMX100MML (ENDOMECHANICALS) ×3 IMPLANT
TROCAR XCEL UNIV SLVE 11M 100M (ENDOMECHANICALS) IMPLANT
TUBE CALIBRATION LAPBAND (TUBING) ×3 IMPLANT
TUBING CONNECTING 10 (TUBING) IMPLANT
TUBING ENDO SMARTCAP (MISCELLANEOUS) ×3 IMPLANT
TUBING FILTER THERMOFLATOR (ELECTROSURGICAL) ×3 IMPLANT
TUBING INSUFFLATION 10FT LAP (TUBING) ×3 IMPLANT

## 2013-05-28 NOTE — Progress Notes (Signed)
Pt. Up to ambulate in halls with RN, pt. Denies any pain or nausea, pt. Ambulated to bathroom, pt. Voided.

## 2013-05-28 NOTE — Progress Notes (Signed)
Phase 2 Short Stay: After getting on and off bedpan x2 states pain has increased to 6/10. The repositioned pt in bed and she is sleeping soundly.

## 2013-05-28 NOTE — Progress Notes (Signed)
Phase 2 Short Stay. Pt awoke stated she needed to void NOW. Assisted to [facility] and voided moderate amt clear yellow urine then said pain was a "6/10" assisted back to stretcher in reclining position and pt fell asleep immediately.

## 2013-05-28 NOTE — Transfer of Care (Signed)
Immediate Anesthesia Transfer of Care Note  Patient: Rachael White  Procedure(s) Performed: Procedure(s) with comments: LAPAROSCOPIC REVISION OF GASTRIC BAND (N/A) - REMOVAL OF GASTRIC BAND AND INSERTION OF NEW AP BAND   Patient Location: PACU  Anesthesia Type:General  Level of Consciousness: awake, patient cooperative and confused  Airway & Oxygen Therapy: Patient Spontanous Breathing and Patient connected to face mask oxygen  Post-op Assessment: Report given to PACU RN and Post -op Vital signs reviewed and stable  Post vital signs: Reviewed and stable  Complications: No apparent anesthesia complications

## 2013-05-28 NOTE — Interval H&P Note (Signed)
History and Physical Interval Note:  05/28/2013 7:25 AM  Rachael White  has presented today for surgery, with the diagnosis of morbid obesity  The various methods of treatment have been discussed with the patient and family. After consideration of risks, benefits and other options for treatment, the patient has consented to  Procedure(s): LAPAROSCOPIC BAND REPLACEMENT TO AP SYSTEM (N/A) as a surgical intervention .  The patient's history has been reviewed, patient examined, no change in status, stable for surgery.  I have reviewed the patient's chart and labs.  Questions were answered to the patient's satisfaction.     Jaquail Mclees B

## 2013-05-28 NOTE — H&P (Signed)
Chief Complaint: Probable leaking 10 cm lap band  History of Present Illness: Rachael White is an 47 y.o. female Who underwent a lap band vagotomy with placement of a 10 cm band July 2007. Her preop weight was 294.2 and she lost a maximum of about 35-40 pounds. We felt that she likely has had a leaking band and graft tested on occasions. We plan to go back on September 2 and replace her 10 cm band with a APS system band.  Past Medical History   Diagnosis  Date   .  Anxiety    .  Depression      depression only    Past Surgical History   Procedure  Laterality  Date   .  Laparoscopic gastric banding   With vagotomy July 2007     Lapband vagotomy study patient   .  Gastric banding port revision   09/04/2012     Procedure: GASTRIC BANDING PORT REVISION; Surgeon: Valarie Merino, MD; Location: [facility]; Service: General; Laterality: N/A; Lap Band Port Revision   .  Breast reduction surgery     .  Knee arthroscopy       bilaterial   .  Wrist ganglion excision      Current Outpatient Prescriptions   Medication  Sig  Dispense  Refill   .  aluminum & magnesium hydroxide-simethicone (MYLANTA) 500-450-40 MG/5ML suspension  Take 15 mLs by mouth every 6 (six) hours as needed. For stomach     .  amphetamine-dextroamphetamine (ADDERALL XR) 25 MG 24 hr capsule  Take 25 mg by mouth daily before breakfast.     .  diazepam (VALIUM) 5 MG tablet  Take 5 mg by mouth every 8 (eight) hours as needed. Anxiety     .  Multiple Vitamin (MULTIVITAMIN) tablet  Take 1 tablet by mouth daily.      No current facility-administered medications for this visit.   Penicillins  Family History   Problem  Relation  Age of Onset   .  Cancer  Father      prostate   Social History: reports that she has never smoked. She does not have any smokeless tobacco history on file. She reports that she drinks about 0.6 ounces of alcohol per week. She reports that she does not use illicit drugs.  REVIEW OF SYSTEMS - PERTINENT POSITIVES  ONLY:  Negative for appetite. No history of DVT. Other systems are negative.  Physical Exam:  Blood pressure 140/86, pulse 74, resp. rate 16, height 5' 6.5" (1.689 m), weight 269 lb 12.8 oz (122.38 kg).  Body mass index is 42.9 kg/(m^2).  Gen: WDWN African American female NAD  Neurological: Alert and oriented to person, place, and time. Motor and sensory function is grossly intact  Head: Normocephalic and atraumatic.  Eyes: Conjunctivae are normal. Pupils are equal, round, and reactive to light. No scleral icterus.  Neck: Normal range of motion. Neck supple. No tracheal deviation or thyromegaly present.  Cardiovascular: SR without murmurs or gallops. No carotid bruits  Respiratory: Effort normal. No respiratory distress. No chest wall tenderness. Breath sounds normal. No wheezes, rales or rhonchi.  Abdomen: Nontender  GU:  Musculoskeletal: Normal range of motion. Extremities are nontender. No cyanosis, edema or clubbing noted Lymphadenopathy: No cervical, preauricular, postauricular or axillary adenopathy is present Skin: Skin is warm and dry. No rash noted. No diaphoresis. No erythema. No pallor. Pscyh: Normal mood and affect. Behavior is normal. Judgment and thought content normal.  LABORATORY RESULTS:  No results found for this or any previous visit (from the past 48 hour(s)).  RADIOLOGY RESULTS:  No results found.  Problem List:  Patient Active Problem List    Diagnosis  Date Noted   .  Lapband Vagotomy + Hiatus Hernia Repair July 2007  06/13/2012   Assessment & Plan:  Removal of 10 cm band with replacement with a APS band.  Matt B. Daphine Deutscher, MD, [facility]  [phone] beeper  [phone]

## 2013-05-28 NOTE — Progress Notes (Addendum)
Arrived to Phase 2 from PACU immediate requested bedpan. States she cannot get up because her "weak bladder' wshe will void on floor. States this has been problem for a few years. She voided moderato amount of clear  urine and 10 minutes later requested bedpan again and voided 150 cc

## 2013-05-28 NOTE — Op Note (Signed)
Surgeon: Wenda Low, MD, FACS  Asst:  Ovidio Kin, M.D., FACS  AnesDicky Doe.  Procedure: Laparoscopic removal of 10 cm lap band and replacement with standard APS lap band  Diagnosis: Leaking 10 cm band  Complications: none  EBL:   5 cc  Description of Procedure:  The patient was taken to room 1 and given general anesthesia. The abdomen was prepped with PCMX and draped sterilely. A timeout was performed. Access to the abdomen was achieved with a 11 mm Optiview technique without difficulty. Insufflation created a space and we created past 2 more 5 mm trochars on the left, 11 on the right inferiorly and made an oblique 15 the right side laterally. 5 mm port was inserted in the subxiphoid region through which the [facility] retractor was introduced area for upper abdominal thorax was quite small aware of and negotiate the tight ribs to slip it in there and retract the left lateral segment.  Adhesions were taken down sharply to the old band. These then exposed the buckle and a portion of the plication which I took down. I then" all the band and then elected to go ahead and cut it into before pulling it out to minimize trauma. The old band was removed from the abdomen and APS band was prepared. This was inserted and the abdomen and meanwhile the band passer was slipped around the old track. The old track looked to be very good and the plication laterally was excellent. I was able to reintroduce and APS band in the same tract bring around buckling. It had a nice lie to it and I plicated with this with an extra suture which made it just about we will are noted.Marland Kitchen Antislip stitch was then placed inferiorly to that.  The old port was removed and the new port was brought up to the same region and connected to a new subcutaneous port which was then implanted with mesh on the back of just lateral to the transverse incision where that was opened for extrication. I did close the depths of the wound after removing  the old port and the mesh behind it and then placed the port laterally in an closed around a slight up into this lateral pocket. The port had a nice lie to it and we checked to be in side and all looked good as well. The wounds were closed with 4-0 Vicryl and with Dermabond. Patient our the procedure well taken recovery room in satisfactory condition.  Matt B. Daphine Deutscher, MD, [facility], [place] [phone]

## 2013-05-28 NOTE — Anesthesia Postprocedure Evaluation (Signed)
  Anesthesia Post-op Note  Patient: Rachael White  Procedure(s) Performed: Procedure(s) (LRB): LAPAROSCOPIC REVISION OF GASTRIC BAND (N/A)  Patient Location: PACU  Anesthesia Type: General  Level of Consciousness: awake and alert   Airway and Oxygen Therapy: Patient Spontanous Breathing  Post-op Pain: mild  Post-op Assessment: Post-op Vital signs reviewed, Patient's Cardiovascular Status Stable, Respiratory Function Stable, Patent Airway and No signs of Nausea or vomiting  Last Vitals:  Filed Vitals:   05/28/13 1015  BP: 132/71  Pulse: 75  Temp:   Resp: 19    Post-op Vital Signs: stable   Complications: No apparent anesthesia complications

## 2013-05-28 NOTE — Interval H&P Note (Signed)
History and Physical Interval Note:  05/28/2013 7:27 AM  Rachael White  has presented today for surgery, with the diagnosis of morbid obesity  The various methods of treatment have been discussed with the patient and family. After consideration of risks, benefits and other options for treatment, the patient has consented to  Procedure(s): LAPAROSCOPIC BAND REPLACEMENT TO AP SYSTEM (N/A) as a surgical intervention .  The patient's history has been reviewed, patient examined, no change in status, stable for surgery.  I have reviewed the patient's chart and labs.  Questions were answered to the patient's satisfaction.     Fernado Brigante B

## 2013-05-28 NOTE — Progress Notes (Signed)
Phase 2 KUB report in EPIC. Awakened pt to discuss pain level Pt states it is a "0-2/10 now" then back to sleep

## 2013-05-28 NOTE — Progress Notes (Signed)
Phase 2. Husband at bedside. Pt awoke suddenly and stated she has to void. Assisted to [facility] and pt c/o nausea with some dry heaving and states her pain is "6/10" and tearful. Voided 250 cc clear yellow urine.  Assisted back to stretcher  Crying stating her pain is a "9/10" when i arrived back with medication to room pt was asleep. She awoke and I gave her Toradol then Zofran. Pt requesting ice. Asked her to wait until nausea has subsided. Report given to Sharyn Creamer

## 2013-05-28 NOTE — Anesthesia Preprocedure Evaluation (Signed)
Anesthesia Evaluation  Patient identified by MRN, date of birth, ID band Patient awake    Reviewed: Allergy & Precautions, H&P , NPO status , Patient's Chart, lab work & pertinent test results  Airway Mallampati: II TM Distance: <3 FB Neck ROM: Full    Dental no notable dental hx.    Pulmonary neg pulmonary ROS,  breath sounds clear to auscultation  Pulmonary exam normal       Cardiovascular negative cardio ROS  Rhythm:Regular Rate:Normal     Neuro/Psych negative neurological ROS  negative psych ROS   GI/Hepatic negative GI ROS, Neg liver ROS,   Endo/Other  Morbid obesity  Renal/GU negative Renal ROS  negative genitourinary   Musculoskeletal negative musculoskeletal ROS (+)   Abdominal   Peds negative pediatric ROS (+)  Hematology negative hematology ROS (+)   Anesthesia Other Findings   Reproductive/Obstetrics negative OB ROS                           Anesthesia Physical Anesthesia Plan  ASA: II  Anesthesia Plan: General   Post-op Pain Management:    Induction: Intravenous  Airway Management Planned: Oral ETT  Additional Equipment:   Intra-op Plan:   Post-operative Plan: Extubation in OR  Informed Consent: I have reviewed the patients History and Physical, chart, labs and discussed the procedure including the risks, benefits and alternatives for the proposed anesthesia with the patient or authorized representative who has indicated his/her understanding and acceptance.   Dental advisory given  Plan Discussed with: CRNA and Surgeon  Anesthesia Plan Comments:         Anesthesia Quick Evaluation  

## 2013-05-29 ENCOUNTER — Encounter (HOSPITAL_COMMUNITY): Payer: Self-pay | Admitting: Surgery

## 2013-06-13 ENCOUNTER — Encounter (INDEPENDENT_AMBULATORY_CARE_PROVIDER_SITE_OTHER): Payer: Self-pay

## 2013-06-13 ENCOUNTER — Ambulatory Visit (INDEPENDENT_AMBULATORY_CARE_PROVIDER_SITE_OTHER): Payer: Managed Care, Other (non HMO) | Admitting: Surgery

## 2013-06-13 ENCOUNTER — Encounter (INDEPENDENT_AMBULATORY_CARE_PROVIDER_SITE_OTHER): Payer: Self-pay | Admitting: Surgery

## 2013-06-13 VITALS — BP 140/80 | HR 64 | Temp 97.3°F | Resp 14 | Ht 66.5 in | Wt 267.4 lb

## 2013-06-13 DIAGNOSIS — Z9884 Bariatric surgery status: Secondary | ICD-10-CM

## 2013-06-13 NOTE — Progress Notes (Signed)
Rachael White 47 y.o.  Body mass index is 42.52 kg/(m^2).  Patient Active Problem List   Diagnosis Date Noted  . Lapband Vagotomy + Hiatus Hernia Repair July 2007 06/13/2012    Allergies  Allergen Reactions  . Penicillins Rash    Unknown childhood allergy.     Past Surgical History  Procedure Laterality Date  . Laparoscopic gastric banding  With vagotomy July 2007    Lapband vagotomy study patient  . Gastric banding port revision  09/04/2012    Procedure: GASTRIC BANDING PORT REVISION;  Surgeon: Valarie Merino, MD;  Location: [facility];  Service: General;  Laterality: N/A;  Lap Band Port Revision  . Breast reduction surgery    . Knee arthroscopy      bilaterial  . Wrist ganglion excision    . Laparoscopic revision of gastric band N/A 05/28/2013    Procedure: LAPAROSCOPIC REVISION OF GASTRIC BAND;  Surgeon: Valarie Merino, MD;  Location: [facility];  Service: General;  Laterality: N/A;  REMOVAL OF GASTRIC BAND AND INSERTION OF NEW AP BAND with MESH   [name], MD No diagnosis found.  Incisions ok. Doing well after replacement of entire Lapband system.  Return 3 weeks for first bandfill.  Matt B. Daphine Deutscher, MD, [facility] [phone] beeper [phone]  06/13/2013 9:42 AM

## 2013-06-13 NOTE — Patient Instructions (Signed)
Thanks for your patience.  If you need further assistance after leaving the office, please call our office and speak with a [facility] nurse.  [phone].  If you want to leave a message for Dr. Machell Wirthlin, please call his office phone at [phone]. 

## 2013-07-05 ENCOUNTER — Ambulatory Visit (INDEPENDENT_AMBULATORY_CARE_PROVIDER_SITE_OTHER): Payer: Managed Care, Other (non HMO) | Admitting: Surgery

## 2013-07-05 ENCOUNTER — Encounter (INDEPENDENT_AMBULATORY_CARE_PROVIDER_SITE_OTHER): Payer: Self-pay | Admitting: Surgery

## 2013-07-05 VITALS — BP 142/92 | HR 92 | Temp 97.8°F | Resp 15 | Ht 66.5 in | Wt 268.2 lb

## 2013-07-05 DIAGNOSIS — Z9884 Bariatric surgery status: Secondary | ICD-10-CM

## 2013-07-05 NOTE — Patient Instructions (Signed)

## 2013-07-05 NOTE — Progress Notes (Signed)
Lapband Fill Encounter Problem List:   Patient Active Problem List   Diagnosis Date Noted  . LapbandVagotomy2007/convert to APS Lapband Sept 2014 06/13/2012    Cala Bradford S Nakata Body mass index is 42.65 kg/(m^2). Weight loss since surgery  26  Having regurgitation?:  no  Feel that they need a fill?  yes  Nocturnal reflux?  no  Amount of fill  1.5     Instructions given and weight loss goals discussed.    This was her first fill since she had her 10 cm band replaced with a APS lapband. She was able to drink after that.   Will see back in 3 weeks.   Matt B. Daphine Deutscher, MD, FACS

## 2013-07-22 ENCOUNTER — Encounter (INDEPENDENT_AMBULATORY_CARE_PROVIDER_SITE_OTHER): Payer: Managed Care, Other (non HMO) | Admitting: Surgery

## 2013-07-23 ENCOUNTER — Encounter (INDEPENDENT_AMBULATORY_CARE_PROVIDER_SITE_OTHER): Payer: Self-pay | Admitting: Surgery

## 2013-07-23 ENCOUNTER — Ambulatory Visit (INDEPENDENT_AMBULATORY_CARE_PROVIDER_SITE_OTHER): Payer: Managed Care, Other (non HMO) | Admitting: Surgery

## 2013-07-23 VITALS — BP 116/82 | HR 76 | Temp 97.4°F | Resp 16 | Ht 66.5 in | Wt 271.0 lb

## 2013-07-23 DIAGNOSIS — Z4651 Encounter for fitting and adjustment of gastric lap band: Secondary | ICD-10-CM | POA: Insufficient documentation

## 2013-07-23 NOTE — Patient Instructions (Signed)

## 2013-07-23 NOTE — Progress Notes (Signed)
Lapband Fill Encounter Problem List:   Patient Active Problem List   Diagnosis Date Noted  . LapbandVagotomy2007/convert to APS Lapband Sept 2014 06/13/2012    Rachael White Body mass index is 43.09 kg/(m^2). Weight loss since surgery  23.2  Having regurgitation?:  no  Feel that they need a fill?  yes  Nocturnal reflux?  no  Amount of fill  1.5     Instructions given and weight loss goals discussed.    Second fill since Change out to APS lapband.  She wants to be tighter.  Able to swallow OK.  Will see back in 4 weeks.   Matt B. Daphine Deutscher, MD, FACS

## 2013-08-20 ENCOUNTER — Ambulatory Visit (INDEPENDENT_AMBULATORY_CARE_PROVIDER_SITE_OTHER): Payer: Managed Care, Other (non HMO) | Admitting: Surgery

## 2013-08-20 VITALS — BP 126/72 | HR 90 | Temp 98.0°F | Resp 18 | Ht 66.5 in | Wt 271.0 lb

## 2013-08-20 DIAGNOSIS — Z4651 Encounter for fitting and adjustment of gastric lap band: Secondary | ICD-10-CM

## 2013-08-20 NOTE — Patient Instructions (Signed)

## 2013-08-20 NOTE — Progress Notes (Signed)
Lapband Fill Encounter Problem List:   Patient Active Problem List   Diagnosis Date Noted  . Fitting and adjustment of gastric lap band 07/23/2013  . LapbandVagotomy2007/convert to APS Lapband Sept 2014 06/13/2012    Rachael White Body mass index is 43.09 kg/(m^2). Weight loss since surgery  no  Having regurgitation?:  no  Feel that they need a fill?  yes  Nocturnal reflux?  no  Amount of fill  0.5     Instructions given and weight loss goals discussed.    Lost her dad on Thanksgiving.  Eating habits poor.  She wanted more but I gave her .5 cc See back in 3 weeks.   Matt B. Daphine Deutscher, MD, FACS

## 2013-09-12 ENCOUNTER — Encounter (INDEPENDENT_AMBULATORY_CARE_PROVIDER_SITE_OTHER): Payer: Managed Care, Other (non HMO) | Admitting: Surgery

## 2013-09-19 ENCOUNTER — Ambulatory Visit (INDEPENDENT_AMBULATORY_CARE_PROVIDER_SITE_OTHER): Payer: Managed Care, Other (non HMO) | Admitting: Physician Assistant

## 2013-09-19 ENCOUNTER — Encounter (INDEPENDENT_AMBULATORY_CARE_PROVIDER_SITE_OTHER): Payer: Self-pay | Admitting: Physician Assistant

## 2013-09-19 ENCOUNTER — Encounter (INDEPENDENT_AMBULATORY_CARE_PROVIDER_SITE_OTHER): Payer: Self-pay

## 2013-09-19 VITALS — BP 140/86 | HR 71 | Temp 97.9°F | Resp 16 | Ht 66.5 in | Wt 274.4 lb

## 2013-09-19 DIAGNOSIS — Z4651 Encounter for fitting and adjustment of gastric lap band: Secondary | ICD-10-CM

## 2013-09-19 NOTE — Progress Notes (Signed)
  HISTORY: Rachael White is a 48 y.o.female who received an AP-Standard lap-band in September 2014 after having a 10 cm band since 2007 by Dr. Daphine DeutscherMartin. She comes in with 3.4 lbs weight gain since her last visit, when Dr. Daphine DeutscherMartin did a 0.5 mL fill. She has significant hunger and larger than desired portion sizes. She denies any regurgitation or reflux symptoms. She is insistent on a fill today.  VITAL SIGNS: Filed Vitals:   09/19/13 1245  BP: 140/86  Pulse: 71  Temp: 97.9 F (36.6 C)  Resp: 16    PHYSICAL EXAM: Physical exam reveals a very well-appearing 48 y.o.female in no apparent distress Neurologic: Awake, alert, oriented Psych: Bright affect, conversant Respiratory: Breathing even and unlabored. No stridor or wheezing Abdomen: Soft, nontender, nondistended to palpation. Incisions well-healed. No incisional hernias. Port easily palpated. Extremities: Atraumatic, good range of motion.  ASSESMENT: 48 y.o.  female  s/p AP-Standard lap-band.   PLAN: The patient's port was accessed with a 20G Huber needle without difficulty. Clear fluid was aspirated and 1 mL saline was added to the port. The patient was able to swallow water without difficulty following the procedure and was instructed to take clear liquids for the next 24-48 hours and advance slowly as tolerated.

## 2013-09-19 NOTE — Patient Instructions (Signed)

## 2013-10-24 ENCOUNTER — Encounter (INDEPENDENT_AMBULATORY_CARE_PROVIDER_SITE_OTHER): Payer: Managed Care, Other (non HMO) | Admitting: Surgery

## 2013-10-25 ENCOUNTER — Telehealth (INDEPENDENT_AMBULATORY_CARE_PROVIDER_SITE_OTHER): Payer: Self-pay

## 2013-10-25 NOTE — Telephone Encounter (Signed)
Called patient to reschedule appointment due to snow on 10/24/13.  Patient has been rescheduled to 11/21/13 @ 12:10pm w/Dr. Daphine DeutscherMartin.

## 2013-11-21 ENCOUNTER — Ambulatory Visit (INDEPENDENT_AMBULATORY_CARE_PROVIDER_SITE_OTHER): Payer: Managed Care, Other (non HMO) | Admitting: Surgery

## 2013-11-21 ENCOUNTER — Encounter (INDEPENDENT_AMBULATORY_CARE_PROVIDER_SITE_OTHER): Payer: Self-pay | Admitting: Surgery

## 2013-11-21 VITALS — BP 158/100 | HR 72 | Resp 14 | Ht 66.5 in | Wt 261.4 lb

## 2013-11-21 DIAGNOSIS — Z4651 Encounter for fitting and adjustment of gastric lap band: Secondary | ICD-10-CM

## 2013-11-21 NOTE — Progress Notes (Signed)
Lapband Fill Encounter Problem List:   Patient Active Problem List   Diagnosis Date Noted  . Fitting and adjustment of gastric lap band 07/23/2013  . LapbandVagotomy2007/convert to APS Lapband Sept 2014 06/13/2012    Cala BradfordKimberly S Selleck Body mass index is 41.56 kg/(m^2). Weight loss since surgery  33  Having regurgitation?:  no  Feel that they need a fill?  yes  Nocturnal reflux?  no  Amount of fill  0.5     Instructions given and weight loss goals discussed.    Has lost 13 lbs since last fill.  Will see her back in 4-5. Able to drink water after fill.     Matt B. Daphine DeutscherMartin, MD, FACS

## 2013-11-21 NOTE — Patient Instructions (Signed)

## 2014-01-03 ENCOUNTER — Ambulatory Visit (INDEPENDENT_AMBULATORY_CARE_PROVIDER_SITE_OTHER): Payer: Managed Care, Other (non HMO) | Admitting: Surgery

## 2014-01-03 ENCOUNTER — Encounter (INDEPENDENT_AMBULATORY_CARE_PROVIDER_SITE_OTHER): Payer: Self-pay | Admitting: Surgery

## 2014-01-03 NOTE — Patient Instructions (Signed)

## 2014-01-03 NOTE — Progress Notes (Signed)
Lapband Fill Encounter Problem List:   Patient Active Problem List   Diagnosis Date Noted  . Fitting and adjustment of gastric lap band 07/23/2013  . LapbandVagotomy2007/convert to APS Lapband Sept 2014 06/13/2012    Rachael BradfordKimberly S White Body mass index is 39.69 kg/(m^2). Weight loss since surgery  44.6  Having regurgitation?:  no  Feel that they need a fill?  yes  Nocturnal reflux?  no  Amount of fill  0.25     Instructions given and weight loss goals discussed.    She is doing well and feels that she needs a little more restriction.  .25 added.  She is a vagotomy patient.   Will see back in 4 weeks.   Matt B. Daphine DeutscherMartin, MD, FACS

## 2014-02-05 ENCOUNTER — Ambulatory Visit (INDEPENDENT_AMBULATORY_CARE_PROVIDER_SITE_OTHER): Payer: Managed Care, Other (non HMO) | Admitting: Surgery

## 2014-02-05 ENCOUNTER — Encounter (INDEPENDENT_AMBULATORY_CARE_PROVIDER_SITE_OTHER): Payer: Self-pay | Admitting: Surgery

## 2014-02-05 VITALS — BP 130/78 | HR 85 | Temp 97.5°F | Resp 16 | Ht 66.0 in | Wt 239.0 lb

## 2014-02-05 DIAGNOSIS — Z9884 Bariatric surgery status: Secondary | ICD-10-CM

## 2014-02-05 NOTE — Patient Instructions (Signed)
Stay vigilant in watching quantity as well as quality of the food that you eat.

## 2014-02-05 NOTE — Progress Notes (Signed)
Rachael White 48 y.o.  Body mass index is 38.59 kg/(m^2).  Patient Active Problem List   Diagnosis Date Noted  . Fitting and adjustment of gastric lap band 07/23/2013  . LapbandVagotomy2007/convert to APS Lapband Sept 2014 06/13/2012    Allergies  Allergen Reactions  . Penicillins Rash    Unknown childhood allergy.       Past Surgical History  Procedure Laterality Date  . Laparoscopic gastric banding  With vagotomy July 2007    Lapband vagotomy study patient  . Gastric banding port revision  09/04/2012    Procedure: GASTRIC BANDING PORT REVISION;  Surgeon: Valarie Merino, MD;  Location: [facility];  Service: General;  Laterality: N/A;  Lap Band Port Revision  . Breast reduction surgery    . Knee arthroscopy      bilaterial  . Wrist ganglion excision    . Laparoscopic revision of gastric band N/A 05/28/2013    Procedure: LAPAROSCOPIC REVISION OF GASTRIC BAND;  Surgeon: Valarie Merino, MD;  Location: [facility];  Service: General;  Laterality: N/A;  REMOVAL OF GASTRIC BAND AND INSERTION OF NEW AP BAND with MESH   [name], MD No diagnosis found.  [name] felt that she was a little on the tight side. A went ahead and removed 0.3 cc from her band.  Hopefully this will address her issues with nocturnal reflux and feeling too tight. Her weight is down 10 pounds and she is looking good. She indicated some issues with her panic kilos and possibly some panniculitis. Will conduct a moderate that during the summer to see if she gets irritation from this. Return in 4 weeks Matt B. Daphine Deutscher, MD, [facility] [phone] beeper [phone]  02/05/2014 11:27 AM

## 2014-02-13 ENCOUNTER — Ambulatory Visit (INDEPENDENT_AMBULATORY_CARE_PROVIDER_SITE_OTHER): Payer: Managed Care, Other (non HMO) | Admitting: Physician Assistant

## 2014-02-13 ENCOUNTER — Encounter (INDEPENDENT_AMBULATORY_CARE_PROVIDER_SITE_OTHER): Payer: Self-pay

## 2014-02-13 VITALS — BP 124/84 | HR 72 | Temp 98.0°F | Resp 14 | Ht 66.5 in | Wt 237.0 lb

## 2014-02-13 DIAGNOSIS — Z4651 Encounter for fitting and adjustment of gastric lap band: Secondary | ICD-10-CM

## 2014-02-13 NOTE — Patient Instructions (Signed)
Return in July. Focus on good food choices as well as physical activity. Return sooner if you have an increase in hunger, portion sizes or weight. Return also for difficulty swallowing, night cough, reflux.   

## 2014-02-13 NOTE — Progress Notes (Signed)
  HISTORY: Rachael White is a 48 y.o.female who received an 10cm lap-band in July 2007 by Dr. Daphine DeutscherMartin. She comes in one week after seeing Dr. Daphine DeutscherMartin for fluid removal due to reflux. He removed 0.3 mL. She says this helped a great deal but despite finishing meals well before bedtime, she still has nocturnal reflux and cough. She is traveling soon and would like some more fluid removed to alleviate her symptoms and to give her a safety net while she's out of town.  VITAL SIGNS: Filed Vitals:   02/13/14 1034  BP: 124/84  Pulse: 72  Temp: 98 F (36.7 C)  Resp: 14    PHYSICAL EXAM: Physical exam reveals a very well-appearing 48 y.o.female in no apparent distress Neurologic: Awake, alert, oriented Psych: Bright affect, conversant Respiratory: Breathing even and unlabored. No stridor or wheezing Abdomen: Soft, nontender, nondistended to palpation. Incisions well-healed. No incisional hernias. Port easily palpated. Extremities: Atraumatic, good range of motion.  ASSESMENT: 48 y.o.  female  s/p 10cm lap-band.   PLAN: The patient's port was accessed with a 20G Huber needle without difficulty. Clear fluid was aspirated and 0.6 mL saline was removed from the port. The patient was advised to concentrate on healthy food choices and to avoid slider foods high in fats and carbohydrates. She will return in July.

## 2014-03-07 ENCOUNTER — Encounter (INDEPENDENT_AMBULATORY_CARE_PROVIDER_SITE_OTHER): Payer: Managed Care, Other (non HMO) | Admitting: Surgery

## 2014-03-27 ENCOUNTER — Encounter (INDEPENDENT_AMBULATORY_CARE_PROVIDER_SITE_OTHER): Payer: Managed Care, Other (non HMO) | Admitting: Surgery

## 2014-04-03 ENCOUNTER — Encounter (INDEPENDENT_AMBULATORY_CARE_PROVIDER_SITE_OTHER): Payer: Managed Care, Other (non HMO)

## 2014-04-17 ENCOUNTER — Ambulatory Visit (INDEPENDENT_AMBULATORY_CARE_PROVIDER_SITE_OTHER): Payer: Managed Care, Other (non HMO) | Admitting: Physician Assistant

## 2014-04-17 ENCOUNTER — Encounter (INDEPENDENT_AMBULATORY_CARE_PROVIDER_SITE_OTHER): Payer: Self-pay

## 2014-04-17 VITALS — BP 138/100 | HR 72 | Temp 98.6°F | Resp 14 | Ht 66.5 in | Wt 245.8 lb

## 2014-04-17 DIAGNOSIS — Z4651 Encounter for fitting and adjustment of gastric lap band: Secondary | ICD-10-CM

## 2014-04-17 NOTE — Progress Notes (Signed)
  HISTORY: Rachael White is a 48 y.o.female who received an AP-Standard lap-band in July 2007 by Dr. Daphine DeutscherMartin. The patient has gained. 8.8 lbs since their last visit in June, and has lost 48 lbs since surgery. She described increased hunger and larger portions since fluid removal but forunately no further GERD symptoms. She is continuing to exercise regularly. She would like some fluid replaced but not to the amount removed during both visits in June. Solids pose no issue for her.  VITAL SIGNS: Filed Vitals:   04/17/14 1116  BP: 138/100  Pulse: 72  Temp: 98.6 F (37 C)  Resp: 14    PHYSICAL EXAM: Physical exam reveals a very well-appearing 48 y.o.female in no apparent distress Neurologic: Awake, alert, oriented Psych: Bright affect, conversant Respiratory: Breathing even and unlabored. No stridor or wheezing Abdomen: Soft, nontender, nondistended to palpation. Incisions well-healed. No incisional hernias. Port easily palpated. Extremities: Atraumatic, good range of motion.  ASSESMENT: 48 y.o.  female  s/p AP-Standard lap-band.   PLAN: The patient's port was accessed with a 20G Huber needle without difficulty. Clear fluid was aspirated and 0.5 mL saline was added to the port. The patient was able to swallow water without difficulty following the procedure and was instructed to take clear liquids for the next 24-48 hours and advance slowly as tolerated. We'll have her return in 3 months or sooner if needed.

## 2014-04-17 NOTE — Patient Instructions (Addendum)

## 2014-05-08 ENCOUNTER — Encounter (INDEPENDENT_AMBULATORY_CARE_PROVIDER_SITE_OTHER): Payer: Managed Care, Other (non HMO)

## 2014-05-29 ENCOUNTER — Encounter (INDEPENDENT_AMBULATORY_CARE_PROVIDER_SITE_OTHER): Payer: Managed Care, Other (non HMO)

## 2014-07-17 ENCOUNTER — Encounter (INDEPENDENT_AMBULATORY_CARE_PROVIDER_SITE_OTHER): Payer: Managed Care, Other (non HMO)

## 2014-12-09 ENCOUNTER — Ambulatory Visit (INDEPENDENT_AMBULATORY_CARE_PROVIDER_SITE_OTHER): Payer: Managed Care, Other (non HMO) | Admitting: Urgent Care

## 2014-12-09 VITALS — BP 118/78 | HR 95 | Temp 99.6°F | Resp 24 | Ht 68.0 in | Wt 232.0 lb

## 2014-12-09 DIAGNOSIS — J101 Influenza due to other identified influenza virus with other respiratory manifestations: Secondary | ICD-10-CM | POA: Diagnosis not present

## 2014-12-09 DIAGNOSIS — R509 Fever, unspecified: Secondary | ICD-10-CM

## 2014-12-09 DIAGNOSIS — M791 Myalgia, unspecified site: Secondary | ICD-10-CM

## 2014-12-09 LAB — POCT INFLUENZA A/B
Influenza A, POC: POSITIVE
Influenza B, POC: NEGATIVE

## 2014-12-09 MED ORDER — OSELTAMIVIR PHOSPHATE 75 MG PO CAPS: 75.0000 mg | ORAL_CAPSULE | Freq: Two times a day (BID) | ORAL | Status: DC

## 2014-12-09 NOTE — Progress Notes (Signed)
    MRN: [id number] DOB: 10/16/1965  Subjective:   Rachael White is a 49 y.o. female presenting for chief complaint of Fever; Generalized Body Aches; and Cough  Reports 1 day history of fever (as high as 103F), myalgia, cough sometimes productive with green sputum but no hemoptysis, headache, left ear pressure. Has tried Advil with some relief. Denies ear pain, ear drainage, eye pain, red eyes, tooth pain, sore throat, chest pain, shob, wheezing, n/v, abdominal pain, diarrhea. Denies smoking, occasional glass of wine. Admits history of seasonal allergies, takes Zyrtec and rhinocort with good relief. Did not take flu shot, no sick contacts. Denies any other aggravating or relieving factors, no other questions or concerns.  Rachael White has a current medication list which includes the following prescription(s): aluminum & magnesium hydroxide-simethicone, amphetamine-dextroamphetamine, diazepam, and multivitamin. She is allergic to penicillins.  Rachael White  has a past medical history of Anxiety; Depression; and Allergy. Also  has past surgical history that includes Laparoscopic gastric banding (With vagotomy July 2007); Gastric banding port revision (09/04/2012); Breast reduction surgery; Knee arthroscopy; Wrist ganglion excision; Laparoscopic revision of gastric band (N/A, 05/28/2013); and Cosmetic surgery.  ROS As in subjective.  Objective:   Vitals: BP 118/78 mmHg  Pulse 95  Temp(Src) 99.6 F (37.6 C) (Oral)  Resp 24  Ht 5\' 8"  (1.727 m)  Wt 232 lb (105.235 kg)  BMI 35.28 kg/m2  SpO2 93%  Physical Exam  Constitutional: She is well-developed, well-nourished, and in no distress.  HENT:  TM's flat bilaterally L>R, no effusions or erythema. Nasal turbinates pink and moist with yellow mucus. No sinus tenderness. Slight postnasal drip present, without oropharyngeal exudates, erythema or abscesses.  Eyes: Conjunctivae are normal. Right eye exhibits no discharge. Left eye exhibits no discharge.  No scleral icterus.  Cardiovascular: Normal rate, regular rhythm and intact distal pulses.  Exam reveals no gallop and no friction rub.   No murmur heard. Pulmonary/Chest: No stridor. No respiratory distress. She has no wheezes. She has no rales. She exhibits no tenderness.  Lymphadenopathy:    She has cervical adenopathy (anterior, bilateral).   Results for orders placed or performed in visit on 12/09/14 (from the past 24 hour(s))  POCT Influenza A/B     Status: None   Collection Time: 12/09/14  8:05 PM  Result Value Ref Range   Influenza A, POC Positive    Influenza B, POC Negative    Assessment and Plan :   1. Myalgia 2. Fever, unspecified fever cause 3. Type A influenza - Start oseltamivir x5 days, fluids and rest, continue Advil for fever and pain. - Return to clinic in 1 week if symptoms fail to improve.  Wallis BambergMario Khaalid Lefkowitz, PA-C [facility] [phone] 12/09/2014 7:54 PM

## 2014-12-09 NOTE — Patient Instructions (Signed)

## 2015-03-26 ENCOUNTER — Ambulatory Visit: Payer: Self-pay | Admitting: Surgery

## 2015-04-23 ENCOUNTER — Encounter (HOSPITAL_COMMUNITY): Payer: Self-pay

## 2015-04-24 NOTE — Patient Instructions (Signed)
Rachael White  04/24/2015   Your procedure is scheduled on:   04/29/2015    Report to [facility] Main  Entrance take [facility]  elevators to 3rd floor to  Short Stay Center at       0900 AM.  Call this number if you have problems the morning of surgery [phone]   Remember: ONLY 1 PERSON MAY GO WITH YOU TO SHORT STAY TO GET  READY MORNING OF YOUR SURGERY.  Do not eat food or drink liquids :After Midnight.            FLEETS ENEMA NITE BEFORE SURGERY      Take these medicines the morning of surgery with A SIP OF WATER: Adderalll, Valium if needed, Zaditory eye drops if needed                                You may not have any metal on your body including hair pins and              piercings  Do not wear jewelry, make-up, lotions, powders or perfumes, deodorant             Do not wear nail polish.  Do not shave  48 hours prior to surgery.  .   Do not bring valuables to the hospital. [place] IS NOT             RESPONSIBLE   FOR VALUABLES.  Contacts, dentures or bridgework may not be worn into surgery.      Patients discharged the day of surgery will not be allowed to drive home.  Name and phone number of your driver:  Special Instructions: coughing and deep breathing exercises,leg exercises               Please read over the following fact sheets you were given: _____________________________________________________________________             [facility] - Preparing for Surgery Before surgery, you can play an important role.  Because skin is not sterile, your skin needs to be as free of germs as possible.  You can reduce the number of germs on your skin by washing with CHG (chlorahexidine gluconate) soap before surgery.  CHG is an antiseptic cleaner which kills germs and bonds with the skin to continue killing germs even after washing. Please DO NOT use if you have an allergy to CHG or antibacterial soaps.  If your skin becomes reddened/irritated stop  using the CHG and inform your nurse when you arrive at Short Stay. Do not shave (including legs and underarms) for at least 48 hours prior to the first CHG shower.  You may shave your face/neck. Please follow these instructions carefully:  1.  Shower with CHG Soap the night before surgery and the  morning of Surgery.  2.  If you choose to wash your hair, wash your hair first as usual with your  normal  shampoo.  3.  After you shampoo, rinse your hair and body thoroughly to remove the  shampoo.                           4.  Use CHG as you would any other liquid soap.  You can apply chg directly  to the skin and wash  Gently with a scrungie or clean washcloth.  5.  Apply the CHG Soap to your body ONLY FROM THE NECK DOWN.   Do not use on face/ open                           Wound or open sores. Avoid contact with eyes, ears mouth and genitals (private parts).                       Wash face,  Genitals (private parts) with your normal soap.             6.  Wash thoroughly, paying special attention to the area where your surgery  will be performed.  7.  Thoroughly rinse your body with warm water from the neck down.  8.  DO NOT shower/wash with your normal soap after using and rinsing off  the CHG Soap.                9.  Pat yourself dry with a clean towel.            10.  Wear clean pajamas.            11.  Place clean sheets on your bed the night of your first shower and do not  sleep with pets. Day of Surgery : Do not apply any lotions/deodorants the morning of surgery.  Please wear clean clothes to the hospital/surgery center.  FAILURE TO FOLLOW THESE INSTRUCTIONS MAY RESULT IN THE CANCELLATION OF YOUR SURGERY PATIENT SIGNATURE_________________________________  NURSE SIGNATURE__________________________________  ________________________________________________________________________

## 2015-04-27 ENCOUNTER — Encounter (HOSPITAL_COMMUNITY): Payer: Self-pay

## 2015-04-27 ENCOUNTER — Encounter (HOSPITAL_COMMUNITY)
Admission: RE | Admit: 2015-04-27 | Discharge: 2015-04-27 | Disposition: A | Discharge status: Home / Self Care | Payer: Managed Care, Other (non HMO) | Source: Ambulatory Visit | Attending: Surgery | Admitting: Surgery

## 2015-04-27 DIAGNOSIS — Z79899 Other long term (current) drug therapy: Secondary | ICD-10-CM | POA: Diagnosis not present

## 2015-04-27 DIAGNOSIS — K648 Other hemorrhoids: Secondary | ICD-10-CM | POA: Diagnosis not present

## 2015-04-27 DIAGNOSIS — K644 Residual hemorrhoidal skin tags: Secondary | ICD-10-CM | POA: Diagnosis not present

## 2015-04-27 DIAGNOSIS — F419 Anxiety disorder, unspecified: Secondary | ICD-10-CM | POA: Diagnosis not present

## 2015-04-27 DIAGNOSIS — Z9884 Bariatric surgery status: Secondary | ICD-10-CM | POA: Diagnosis not present

## 2015-04-27 DIAGNOSIS — Z880 Allergy status to penicillin: Secondary | ICD-10-CM | POA: Diagnosis not present

## 2015-04-27 LAB — HCG, SERUM, QUALITATIVE: Preg, Serum: NEGATIVE

## 2015-04-27 LAB — CBC
HEMATOCRIT: 30.8 % — AB (ref 36.0–46.0)
Hemoglobin: 9.4 g/dL — ABNORMAL LOW (ref 12.0–15.0)
MCH: 23.3 pg — AB (ref 26.0–34.0)
MCHC: 30.5 g/dL (ref 30.0–36.0)
MCV: 76.4 fL — AB (ref 78.0–100.0)
Platelets: 377 10*3/uL (ref 150–400)
RBC: 4.03 MIL/uL (ref 3.87–5.11)
RDW: 15.9 % — AB (ref 11.5–15.5)
WBC: 7.9 10*3/uL (ref 4.0–10.5)

## 2015-04-27 NOTE — Progress Notes (Signed)
CBC done 04/27/2015 in EPIC routed via EPIC to Dr Luretha Murphy.

## 2015-04-28 LAB — HEMOGLOBIN A1C
Hgb A1c MFr Bld: 6.3 % — ABNORMAL HIGH (ref 4.8–5.6)
MEAN PLASMA GLUCOSE: 134 mg/dL

## 2015-04-28 NOTE — Progress Notes (Signed)
HGA1C result done 04/27/15 faxed via EPIC to Dr Daphine Deutscher.

## 2015-04-29 ENCOUNTER — Encounter (HOSPITAL_COMMUNITY): Payer: Self-pay | Admitting: *Deleted

## 2015-04-29 ENCOUNTER — Ambulatory Visit (HOSPITAL_COMMUNITY): Payer: Managed Care, Other (non HMO) | Admitting: Certified Registered Nurse Anesthetist

## 2015-04-29 ENCOUNTER — Ambulatory Visit (HOSPITAL_COMMUNITY)
Admission: RE | Admit: 2015-04-29 | Discharge: 2015-04-29 | Disposition: A | Discharge status: Home / Self Care | Payer: Managed Care, Other (non HMO) | Source: Ambulatory Visit | Attending: Surgery | Admitting: Surgery

## 2015-04-29 ENCOUNTER — Encounter (HOSPITAL_COMMUNITY)
Admission: RE | Disposition: A | Discharge status: Home / Self Care | Payer: Self-pay | Source: Ambulatory Visit | Attending: Surgery

## 2015-04-29 DIAGNOSIS — K644 Residual hemorrhoidal skin tags: Secondary | ICD-10-CM | POA: Insufficient documentation

## 2015-04-29 DIAGNOSIS — Z9884 Bariatric surgery status: Secondary | ICD-10-CM | POA: Insufficient documentation

## 2015-04-29 DIAGNOSIS — K648 Other hemorrhoids: Secondary | ICD-10-CM | POA: Insufficient documentation

## 2015-04-29 DIAGNOSIS — Z79899 Other long term (current) drug therapy: Secondary | ICD-10-CM | POA: Insufficient documentation

## 2015-04-29 DIAGNOSIS — F419 Anxiety disorder, unspecified: Secondary | ICD-10-CM | POA: Insufficient documentation

## 2015-04-29 DIAGNOSIS — Z880 Allergy status to penicillin: Secondary | ICD-10-CM | POA: Insufficient documentation

## 2015-04-29 HISTORY — PX: EVALUATION UNDER ANESTHESIA WITH HEMORRHOIDECTOMY: SHX5624

## 2015-04-29 SURGERY — EXAM UNDER ANESTHESIA WITH HEMORRHOIDECTOMY
Anesthesia: General | Site: Rectum

## 2015-04-29 MED ORDER — LACTATED RINGERS IV SOLN
INTRAVENOUS | Status: DC
Start: 2015-04-29 — End: 2015-04-29

## 2015-04-29 MED ORDER — SODIUM CHLORIDE 0.9 % IJ SOLN
INTRAMUSCULAR | Status: AC
  Filled 2015-04-29: qty 10

## 2015-04-29 MED ORDER — ONDANSETRON HCL 4 MG/2ML IJ SOLN
INTRAMUSCULAR | Status: DC | PRN
  Administered 2015-04-29: 4 mg via INTRAVENOUS

## 2015-04-29 MED ORDER — FLEET ENEMA 7-19 GM/118ML RE ENEM: 1.0000 | ENEMA | Freq: Once | RECTAL | Status: DC

## 2015-04-29 MED ORDER — OXYCODONE HCL 5 MG PO TABS: 5.0000 mg | ORAL_TABLET | ORAL | Status: DC | PRN

## 2015-04-29 MED ORDER — DEXAMETHASONE SODIUM PHOSPHATE 10 MG/ML IJ SOLN
INTRAMUSCULAR | Status: DC | PRN
  Administered 2015-04-29: 10 mg via INTRAVENOUS

## 2015-04-29 MED ORDER — FENTANYL CITRATE (PF) 100 MCG/2ML IJ SOLN
INTRAMUSCULAR | Status: AC
  Filled 2015-04-29: qty 2

## 2015-04-29 MED ORDER — LIDOCAINE HCL (CARDIAC) 20 MG/ML IV SOLN
INTRAVENOUS | Status: AC
  Filled 2015-04-29: qty 5

## 2015-04-29 MED ORDER — SODIUM CHLORIDE 0.9 % IJ SOLN: 3.0000 mL | INTRAMUSCULAR | Status: DC | PRN

## 2015-04-29 MED ORDER — FENTANYL CITRATE (PF) 250 MCG/5ML IJ SOLN
INTRAMUSCULAR | Status: AC
  Filled 2015-04-29: qty 25

## 2015-04-29 MED ORDER — FENTANYL CITRATE (PF) 100 MCG/2ML IJ SOLN
INTRAMUSCULAR | Status: DC | PRN
  Administered 2015-04-29 (×2): 50 ug via INTRAVENOUS

## 2015-04-29 MED ORDER — MIDAZOLAM HCL 2 MG/2ML IJ SOLN
INTRAMUSCULAR | Status: AC
  Filled 2015-04-29: qty 4

## 2015-04-29 MED ORDER — POVIDONE-IODINE 10 % EX OINT
TOPICAL_OINTMENT | CUTANEOUS | Status: AC
  Filled 2015-04-29: qty 28.35

## 2015-04-29 MED ORDER — PROPOFOL 10 MG/ML IV BOLUS
INTRAVENOUS | Status: DC | PRN
  Administered 2015-04-29: 200 mg via INTRAVENOUS

## 2015-04-29 MED ORDER — ACETAMINOPHEN 650 MG RE SUPP
650.0000 mg | RECTAL | Status: DC | PRN
  Filled 2015-04-29: qty 1

## 2015-04-29 MED ORDER — SODIUM CHLORIDE 0.9 % IJ SOLN: 3.0000 mL | Freq: Two times a day (BID) | INTRAMUSCULAR | Status: DC

## 2015-04-29 MED ORDER — SODIUM CHLORIDE 0.9 % IV SOLN: 250.0000 mL | INTRAVENOUS | Status: DC | PRN

## 2015-04-29 MED ORDER — CHLORHEXIDINE GLUCONATE CLOTH 2 % EX PADS: 6.0000 | MEDICATED_PAD | Freq: Once | CUTANEOUS | Status: DC

## 2015-04-29 MED ORDER — MIDAZOLAM HCL 5 MG/5ML IJ SOLN
INTRAMUSCULAR | Status: DC | PRN
  Administered 2015-04-29: 2 mg via INTRAVENOUS

## 2015-04-29 MED ORDER — ACETAMINOPHEN 325 MG PO TABS: 650.0000 mg | ORAL_TABLET | ORAL | Status: DC | PRN

## 2015-04-29 MED ORDER — CEFOXITIN SODIUM 2 G IV SOLR
2.0000 g | INTRAVENOUS | Status: DC | PRN
  Administered 2015-04-29: 2 g via INTRAVENOUS

## 2015-04-29 MED ORDER — HEPARIN SODIUM (PORCINE) 5000 UNIT/ML IJ SOLN
5000.0000 [IU] | Freq: Once | INTRAMUSCULAR | Status: AC
  Administered 2015-04-29: 5000 [IU] via SUBCUTANEOUS
  Filled 2015-04-29: qty 1

## 2015-04-29 MED ORDER — FENTANYL CITRATE (PF) 100 MCG/2ML IJ SOLN: 25.0000 ug | INTRAMUSCULAR | Status: DC | PRN

## 2015-04-29 MED ORDER — ONDANSETRON HCL 4 MG/2ML IJ SOLN
INTRAMUSCULAR | Status: AC
  Filled 2015-04-29: qty 2

## 2015-04-29 MED ORDER — OXYCODONE HCL 5 MG PO TABS
5.0000 mg | ORAL_TABLET | ORAL | Status: DC | PRN
  Administered 2015-04-29: 5 mg via ORAL
  Filled 2015-04-29: qty 1

## 2015-04-29 MED ORDER — LACTATED RINGERS IV SOLN
INTRAVENOUS | Status: DC
Start: 2015-04-29 — End: 2015-04-29
  Administered 2015-04-29: 13:00:00 via INTRAVENOUS
  Administered 2015-04-29: 1000 mL via INTRAVENOUS

## 2015-04-29 MED ORDER — PROPOFOL 10 MG/ML IV BOLUS
INTRAVENOUS | Status: AC
  Filled 2015-04-29: qty 20

## 2015-04-29 MED ORDER — LIDOCAINE HCL (CARDIAC) 20 MG/ML IV SOLN
INTRAVENOUS | Status: DC | PRN
  Administered 2015-04-29: 100 mg via INTRAVENOUS

## 2015-04-29 MED ORDER — CEFOXITIN SODIUM 2 G IV SOLR
INTRAVENOUS | Status: AC
  Filled 2015-04-29: qty 2

## 2015-04-29 MED ORDER — BUPIVACAINE LIPOSOME 1.3 % IJ SUSP
20.0000 mL | Freq: Once | INTRAMUSCULAR | Status: AC
  Administered 2015-04-29: 20 mL
  Filled 2015-04-29: qty 20

## 2015-04-29 SURGICAL SUPPLY — 27 items
BLADE HEX COATED 2.75 (ELECTRODE) ×2 IMPLANT
BLADE SURG 15 STRL LF DISP TIS (BLADE) ×1 IMPLANT
BLADE SURG 15 STRL SS (BLADE) ×1
BRIEF STRETCH FOR OB PAD LRG (UNDERPADS AND DIAPERS) ×2 IMPLANT
COVER SURGICAL LIGHT HANDLE (MISCELLANEOUS) ×2 IMPLANT
DECANTER SPIKE VIAL GLASS SM (MISCELLANEOUS) ×2 IMPLANT
DRSG PAD ABDOMINAL 8X10 ST (GAUZE/BANDAGES/DRESSINGS) ×2 IMPLANT
ELECT PENCIL ROCKER SW 15FT (MISCELLANEOUS) ×2 IMPLANT
ELECT REM PT RETURN 9FT ADLT (ELECTROSURGICAL) ×2
ELECTRODE REM PT RTRN 9FT ADLT (ELECTROSURGICAL) ×1 IMPLANT
GAUZE SPONGE 4X4 12PLY STRL (GAUZE/BANDAGES/DRESSINGS) ×2 IMPLANT
GAUZE SPONGE 4X4 16PLY XRAY LF (GAUZE/BANDAGES/DRESSINGS) ×2 IMPLANT
GLOVE BIOGEL M 8.0 STRL (GLOVE) ×2 IMPLANT
GOWN SPEC L4 XLG W/TWL (GOWN DISPOSABLE) ×2 IMPLANT
GOWN STRL REUS W/TWL XL LVL3 (GOWN DISPOSABLE) ×6 IMPLANT
KIT BASIN OR (CUSTOM PROCEDURE TRAY) ×2 IMPLANT
LUBRICANT JELLY K Y 4OZ (MISCELLANEOUS) ×2 IMPLANT
NEEDLE HYPO 22GX1.5 SAFETY (NEEDLE) ×2 IMPLANT
NS IRRIG 1000ML POUR BTL (IV SOLUTION) ×2 IMPLANT
PACK LITHOTOMY IV (CUSTOM PROCEDURE TRAY) ×2 IMPLANT
SHEARS HARMONIC 9CM CVD (BLADE) ×2 IMPLANT
SUT CHROMIC 2 0 SH (SUTURE) IMPLANT
SUT CHROMIC 3 0 SH 27 (SUTURE) IMPLANT
SYR 20CC LL (SYRINGE) ×2 IMPLANT
TOWEL OR 17X26 10 PK STRL BLUE (TOWEL DISPOSABLE) ×2 IMPLANT
UNDERPAD 30X30 INCONTINENT (UNDERPADS AND DIAPERS) ×2 IMPLANT
YANKAUER SUCT BULB TIP 10FT TU (MISCELLANEOUS) ×2 IMPLANT

## 2015-04-29 NOTE — Anesthesia Postprocedure Evaluation (Signed)
  Anesthesia Post-op Note  Patient: Rachael White  Procedure(s) Performed: Procedure(s) (LRB): EXAM UNDER ANESTHESIA WITH INTERNAL HEMORRHOID BANDING AND EXTERNAL HEMORRHOIDECTOMY (N/A)  Patient Location: PACU  Anesthesia Type: General  Level of Consciousness: awake and alert   Airway and Oxygen Therapy: Patient Spontanous Breathing  Post-op Pain: mild  Post-op Assessment: Post-op Vital signs reviewed, Patient's Cardiovascular Status Stable, Respiratory Function Stable, Patent Airway and No signs of Nausea or vomiting  Last Vitals:  Filed Vitals:   04/29/15 1502  BP: 129/74  Pulse: 85  Temp:   Resp: 16    Post-op Vital Signs: stable   Complications: No apparent anesthesia complications

## 2015-04-29 NOTE — H&P (Signed)
Chief Complaint:  Bleeding and difficulty wiping from hemorrhoids  History of Present Illness:  Rachael White is an 49 y.o. female who has had problems with hemorrhoids mainly bleeding and difficulty with wiping and hygiene.  She was seen in the office and arrangements made for EUA, banding, hemorrhoidectomy.    Past Medical History  Diagnosis Date  . Anxiety   . Allergy     Past Surgical History  Procedure Laterality Date  . Laparoscopic gastric banding  With vagotomy July 2007    Lapband vagotomy study patient  . Gastric banding port revision  09/04/2012    Procedure: GASTRIC BANDING PORT REVISION;  Surgeon: Valarie Merino, MD;  Location: [facility];  Service: General;  Laterality: N/A;  Lap Band Port Revision  . Breast reduction surgery    . Knee arthroscopy      bilaterial  . Wrist ganglion excision    . Laparoscopic revision of gastric band N/A 05/28/2013    Procedure: LAPAROSCOPIC REVISION OF GASTRIC BAND;  Surgeon: Valarie Merino, MD;  Location: [facility];  Service: General;  Laterality: N/A;  REMOVAL OF GASTRIC BAND AND INSERTION OF NEW AP BAND with MESH  . Cosmetic surgery    . Nasal sinus surgery    . Tonsillectomy      Current Facility-Administered Medications  Medication Dose Route Frequency Provider Last Rate Last Dose  . Chlorhexidine Gluconate Cloth 2 % PADS 6 each  6 each Topical Once Luretha Murphy, MD       And  . Chlorhexidine Gluconate Cloth 2 % PADS 6 each  6 each Topical Once Luretha Murphy, MD      . lactated ringers infusion   Intravenous Continuous Ronelle Nigh, MD 125 mL/hr at 04/29/15 1043 1,000 mL at 04/29/15 1043  . sodium phosphate (FLEET) 7-19 GM/118ML enema 1 enema  1 enema Rectal Once Luretha Murphy, MD       Penicillins Family History  Problem Relation Age of Onset  . Cancer Father     prostate  . Diabetes Mother   . Heart disease Mother   . Diabetes Brother   . Diabetes Maternal Grandmother    Social History:   reports that she has never  smoked. She has never used smokeless tobacco. She reports that she drinks about 0.6 - 1.8 oz of alcohol per week. She reports that she does not use illicit drugs.   REVIEW OF SYSTEMS : Negative except for see problem list  Physical Exam:   Blood pressure 113/80, pulse 91, temperature 98.5 F (36.9 C), temperature source Oral, resp. rate 18, height 5' 6.5" (1.689 m), weight 102.059 kg (225 lb), last menstrual period 03/29/2015, SpO2 99 %. Body mass index is 35.78 kg/(m^2).  Gen:  WDWN AAF NAD  Neurological: Alert and oriented to person, place, and time. Motor and sensory function is grossly intact  Head: Normocephalic and atraumatic.  Eyes: Conjunctivae are normal. Pupils are equal, round, and reactive to light. No scleral icterus.  Neck: Normal range of motion. Neck supple. No tracheal deviation or thyromegaly present.  Cardiovascular:  SR without murmurs or gallops.  No carotid bruits Breast:  Not examined Respiratory: Effort normal.  No respiratory distress. No chest wall tenderness. Breath sounds normal.  No wheezes, rales or rhonchi.  Abdomen:  nontender with lapband port in place GU:   Rectal with skin tags and intermittently prolapsing hemorrhoids Musculoskeletal: Normal range of motion. Extremities are nontender. No cyanosis, edema or clubbing noted Lymphadenopathy: No cervical, preauricular,  postauricular or axillary adenopathy is present Skin: Skin is warm and dry. No rash noted. No diaphoresis. No erythema. No pallor. Pscyh: Normal mood and affect. Behavior is normal. Judgment and thought content normal.   LABORATORY RESULTS: Results for orders placed or performed during the hospital encounter of 04/27/15 (from the past 48 hour(s))  Hemoglobin A1c     Status: Abnormal   Collection Time: 04/27/15  1:10 PM  Result Value Ref Range   Hgb A1c MFr Bld 6.3 (H) 4.8 - 5.6 %    Comment: (NOTE)         Pre-diabetes: 5.7 - 6.4         Diabetes: >6.4         Glycemic control for adults  with diabetes: <7.0    Mean Plasma Glucose 134 mg/dL    Comment: (NOTE) Performed At: [facility] [address] [place], [place] [id number] Mila Homer MD JO:[id number]   CBC     Status: Abnormal   Collection Time: 04/27/15  1:10 PM  Result Value Ref Range   WBC 7.9 4.0 - 10.5 K/uL   RBC 4.03 3.87 - 5.11 MIL/uL   Hemoglobin 9.4 (L) 12.0 - 15.0 g/dL   HCT 60.1 (L) 09.3 - 23.5 %   MCV 76.4 (L) 78.0 - 100.0 fL   MCH 23.3 (L) 26.0 - 34.0 pg   MCHC 30.5 30.0 - 36.0 g/dL   RDW 57.3 (H) 22.0 - 25.4 %   Platelets 377 150 - 400 K/uL  hCG, serum, qualitative     Status: None   Collection Time: 04/27/15  1:10 PM  Result Value Ref Range   Preg, Serum NEGATIVE NEGATIVE    Comment:        THE SENSITIVITY OF THIS METHODOLOGY IS >10 mIU/mL.      RADIOLOGY RESULTS: No results found.  Problem List: Patient Active Problem List   Diagnosis Date Noted  . Fitting and adjustment of gastric lap band 07/23/2013  . LapbandVagotomy2007/convert to APS Lapband Sept 2014 06/13/2012    Assessment & Plan: Hemorrhoids Plan EUA and hemorrhoidectomy    Matt B. Daphine Deutscher, MD, [facility] [phone] beeper [phone]  04/29/2015 11:08 AM

## 2015-04-29 NOTE — Transfer of Care (Signed)
Immediate Anesthesia Transfer of Care Note  Patient: Rachael White  Procedure(s) Performed: Procedure(s): EXAM UNDER ANESTHESIA WITH INTERNAL HEMORRHOID BANDING AND EXTERNAL HEMORRHOIDECTOMY (N/A)  Patient Location: PACU  Anesthesia Type:General  Level of Consciousness:  sedated, patient cooperative and responds to stimulation  Airway & Oxygen Therapy:Patient Spontanous Breathing and Patient connected to face mask oxgen  Post-op Assessment:  Report given to PACU RN and Post -op Vital signs reviewed and stable  Post vital signs:  Reviewed and stable  Last Vitals:  Filed Vitals:   04/29/15 0917  BP: 113/80  Pulse: 91  Temp: 36.9 C  Resp: 18    Complications: No apparent anesthesia complications

## 2015-04-29 NOTE — Anesthesia Procedure Notes (Signed)
Procedure Name: LMA Insertion Date/Time: 04/29/2015 11:50 AM Performed by: Epimenio Sarin Pre-anesthesia Checklist: Patient identified, Emergency Drugs available, Suction available, Patient being monitored and Timeout performed Patient Re-evaluated:Patient Re-evaluated prior to inductionOxygen Delivery Method: Circle system utilized Preoxygenation: Pre-oxygenation with 100% oxygen Intubation Type: IV induction Ventilation: Mask ventilation without difficulty LMA: LMA with gastric port inserted LMA Size: 4.0 Number of attempts: 1 Dental Injury: Teeth and Oropharynx as per pre-operative assessment

## 2015-04-29 NOTE — Brief Op Note (Signed)
04/29/2015  12:35 PM  PATIENT:  Godfrey Pick Riechers  49 y.o. female  PRE-OPERATIVE DIAGNOSIS:  prolapsed hemorrhoids  POST-OPERATIVE DIAGNOSIS:  prolapsed hemorrhoids  PROCEDURE:  Procedure(s): EXAM UNDER ANESTHESIA WITH INTERNAL HEMORRHOID BANDING AND EXTERNAL HEMORRHOIDECTOMY (N/A)  SURGEON:  Surgeon(s) and Role:    * Luretha Murphy, MD - Primary  PHYSICIAN ASSISTANT:   ASSISTANTS: none   ANESTHESIA:   general  EBL:     BLOOD ADMINISTERED:none  DRAINS: none   LOCAL MEDICATIONS USED:  BUPIVICAINE   SPECIMEN:  Source of Specimen:  hemorrhoids external  DISPOSITION OF SPECIMEN:  PATHOLOGY  COUNTS:  YES  TOURNIQUET:  * No tourniquets in log *  DICTATION: .Other Dictation: Dictation Number [id number]  PLAN OF CARE: Discharge to home after PACU  PATIENT DISPOSITION:  PACU - hemodynamically stable.   Delay start of Pharmacological VTE agent (>24hrs) due to surgical blood loss or risk of bleeding: not applicable

## 2015-04-29 NOTE — Interval H&P Note (Signed)
History and Physical Interval Note:  04/29/2015 11:12 AM  Rachael White  has presented today for surgery, with the diagnosis of prolapsed hemorrhoids  The various methods of treatment have been discussed with the patient and family. After consideration of risks, benefits and other options for treatment, the patient has consented to  Procedure(s): EXAM UNDER ANESTHESIA WITH HEMORRHOID BANDING VS HEMORRHOIDECTOMY (N/A) as a surgical intervention .  The patient's history has been reviewed, patient examined, no change in status, stable for surgery.  I have reviewed the patient's chart and labs.  Questions were answered to the patient's satisfaction.     Lexa Coronado B

## 2015-04-29 NOTE — Anesthesia Preprocedure Evaluation (Addendum)
Anesthesia Evaluation  Patient identified by MRN, date of birth, ID band Patient awake    Reviewed: Allergy & Precautions, H&P , NPO status , Patient's Chart, lab work & pertinent test results  Airway Mallampati: II  TM Distance: >3 FB Neck ROM: Full    Dental no notable dental hx. (+) Dental Advisory Given, Teeth Intact   Pulmonary neg pulmonary ROS,  breath sounds clear to auscultation  Pulmonary exam normal       Cardiovascular Exercise Tolerance: Good negative cardio ROS Normal cardiovascular examRhythm:Regular Rate:Normal     Neuro/Psych negative neurological ROS  negative psych ROS   GI/Hepatic negative GI ROS, Neg liver ROS,   Endo/Other  negative endocrine ROS  Renal/GU negative Renal ROS  negative genitourinary   Musculoskeletal negative musculoskeletal ROS (+)   Abdominal (+) + obese,   Peds negative pediatric ROS (+)  Hematology negative hematology ROS (+)   Anesthesia Other Findings   Reproductive/Obstetrics negative OB ROS                            Anesthesia Physical Anesthesia Plan  ASA: II  Anesthesia Plan: General   Post-op Pain Management:    Induction: Intravenous  Airway Management Planned: LMA  Additional Equipment:   Intra-op Plan:   Post-operative Plan:   Informed Consent: I have reviewed the patients History and Physical, chart, labs and discussed the procedure including the risks, benefits and alternatives for the proposed anesthesia with the patient or authorized representative who has indicated his/her understanding and acceptance.   Dental Advisory Given  Plan Discussed with: CRNA and Surgeon  Anesthesia Plan Comments:         Anesthesia Quick Evaluation

## 2015-04-29 NOTE — Op Note (Signed)
NAMEGRACELYN, White NO.:  [id number]  MEDICAL RECORD NO.:  [id number]  LOCATION:  [facility]                         FACILITY:  [facility]  PHYSICIAN:  Thornton Park. Daphine Deutscher, MD  DATE OF BIRTH:  01/22/1966  DATE OF PROCEDURE:  04/29/2015 DATE OF DISCHARGE:  04/29/2015                              OPERATIVE REPORT   PREOPERATIVE DIAGNOSES:  Hemorrhoids with bleeding, prolapse, and hygiene difficulties.  PROCEDURE:  Internal hemorrhoidal banding at the 12 o'clock and 6 o'clock positions with the patient in the dorsal lithotomy position and with excision of external hemorrhoids at the 12 o'clock, 8 o'clock, and 5 o'clock positions.  SURGEON:  Thornton Park. Daphine Deutscher, MD  ANESTHESIA:  General.  DESCRIPTION OF PROCEDURE:  The patient was in the dorsal lithotomy position.  Exam under anesthesia was performed.  She had a little skin tag anteriorly, which was excised.  This was a small pedunculated more of a polyp than a tag.  I had to wait on the banding material, but found 2 large internal columns that would be amenable to banding.  I banded these at the 12 o'clock position using a double band applier at 12 o'clock and at 6 o'clock.  Following this, there was some redundant skin at 12 o'clock, which was excised using the Harmonic Scalpel.  There was no bleeding.  Again, there was a similar configuration of redundant skin and an external hemorrhoid at the 8 o'clock position and also at the 5 o'clock position.  The 5 o'clock position also included some prolapse, which was excised.  These areas were approximated with 3-0 chromic. There was no bleeding in these sites.  The entire anal ring was injected with 20 mL of Exparel full strength.  The patient tolerated the procedure well.  She will go to PACU and plan discharge home.     Thornton Park Daphine Deutscher, MD     MBM/MEDQ  D:  04/29/2015  T:  04/29/2015  Job:  [id number]

## 2015-04-29 NOTE — Discharge Instructions (Signed)
Hemorrhoids °Hemorrhoids are swollen veins around the rectum or anus. There are two types of hemorrhoids:  °· Internal hemorrhoids. These occur in the veins just inside the rectum. They may poke through to the outside and become irritated and painful. °· External hemorrhoids. These occur in the veins outside the anus and can be felt as a painful swelling or hard lump near the anus. °CAUSES °· Pregnancy.   °· Obesity.   °· Constipation or diarrhea.   °· Straining to have a bowel movement.   °· Sitting for long periods on the toilet. °· Heavy lifting or other activity that caused you to strain. °· Anal intercourse. °SYMPTOMS  °· Pain.   °· Anal itching or irritation.   °· Rectal bleeding.   °· Fecal leakage.   °· Anal swelling.   °· One or more lumps around the anus.   °DIAGNOSIS  °Your caregiver may be able to diagnose hemorrhoids by visual examination. Other examinations or tests that may be performed include:  °· Examination of the rectal area with a gloved hand (digital rectal exam).   °· Examination of anal canal using a small tube (scope).   °· A blood test if you have lost a significant amount of blood. °· A test to look inside the colon (sigmoidoscopy or colonoscopy). °TREATMENT °Most hemorrhoids can be treated at home. However, if symptoms do not seem to be getting better or if you have a lot of rectal bleeding, your caregiver may perform a procedure to help make the hemorrhoids get smaller or remove them completely. Possible treatments include:  °· Placing a rubber band at the base of the hemorrhoid to cut off the circulation (rubber band ligation).   °· Injecting a chemical to shrink the hemorrhoid (sclerotherapy).   °· Using a tool to burn the hemorrhoid (infrared light therapy).   °· Surgically removing the hemorrhoid (hemorrhoidectomy).   °· Stapling the hemorrhoid to block blood flow to the tissue (hemorrhoid stapling).   °HOME CARE INSTRUCTIONS  °· Eat foods with fiber, such as whole grains, beans,  nuts, fruits, and vegetables. Ask your doctor about taking products with added fiber in them (fiber supplements). °· Increase fluid intake. Drink enough water and fluids to keep your urine clear or pale yellow.   °· Exercise regularly.   °· Go to the bathroom when you have the urge to have a bowel movement. Do not wait.   °· Avoid straining to have bowel movements.   °· Keep the anal area dry and clean. Use wet toilet paper or moist towelettes after a bowel movement.   °· Medicated creams and suppositories may be used or applied as directed.   °· Only take over-the-counter or prescription medicines as directed by your caregiver.   °· Take warm sitz baths for 15-20 minutes, 3-4 times a day to ease pain and discomfort.   °· Place ice packs on the hemorrhoids if they are tender and swollen. Using ice packs between sitz baths may be helpful.   °¨ Put ice in a plastic bag.   °¨ Place a towel between your skin and the bag.   °¨ Leave the ice on for 15-20 minutes, 3-4 times a day.   °· Do not use a donut-shaped pillow or sit on the toilet for long periods. This increases blood pooling and pain.   °SEEK MEDICAL CARE IF: °· You have increasing pain and swelling that is not controlled by treatment or medicine. °· You have uncontrolled bleeding. °· You have difficulty or you are unable to have a bowel movement. °· You have pain or inflammation outside the area of the hemorrhoids. °MAKE SURE YOU: °· Understand these instructions. °·   Will watch your condition.  Will get help right away if you are not doing well or get worse. Document Released: 08/12/2000 Document Revised: 08/01/2012 Document Reviewed: 06/19/2012 [facility] Patient Information 2015 [place], [place]. This information is not intended to replace advice given to you by your health care provider. Make sure you discuss any questions you have with your health care provider.      General Anesthesia, Care After Refer to this sheet in the next few weeks. These  instructions provide you with information on caring for yourself after your procedure. Your health care provider may also give you more specific instructions. Your treatment has been planned according to current medical practices, but problems sometimes occur. Call your health care provider if you have any problems or questions after your procedure. WHAT TO EXPECT AFTER THE PROCEDURE After the procedure, it is typical to experience:  Sleepiness.  Nausea and vomiting. HOME CARE INSTRUCTIONS  For the first 24 hours after general anesthesia:  Have a responsible person with you.  Do not drive a car. If you are alone, do not take public transportation.  Do not drink alcohol.  Do not take medicine that has not been prescribed by your health care provider.  Do not sign important papers or make important decisions.  You may resume a normal diet and activities as directed by your health care provider.  Change bandages (dressings) as directed.  If you have questions or problems that seem related to general anesthesia, call the hospital and ask for the anesthetist or anesthesiologist on call. SEEK MEDICAL CARE IF:  You have nausea and vomiting that continue the day after anesthesia.  You develop a rash. SEEK IMMEDIATE MEDICAL CARE IF:   You have difficulty breathing.  You have chest pain.  You have any allergic problems. Document Released: 11/21/2000 Document Revised: 08/20/2013 Document Reviewed: 02/28/2013 [facility] Patient Information 2015 [place], [place]. This information is not intended to replace advice given to you by your health care provider. Make sure you discuss any questions you have with your health care provider.

## 2015-04-30 ENCOUNTER — Encounter (HOSPITAL_COMMUNITY): Payer: Self-pay | Admitting: Surgery

## 2016-06-30 ENCOUNTER — Other Ambulatory Visit: Payer: Self-pay | Admitting: Internal Medicine

## 2016-06-30 DIAGNOSIS — Z1231 Encounter for screening mammogram for malignant neoplasm of breast: Secondary | ICD-10-CM

## 2016-07-14 ENCOUNTER — Ambulatory Visit
Admission: RE | Admit: 2016-07-14 | Discharge: 2016-07-14 | Disposition: A | Discharge status: Home / Self Care | Payer: Managed Care, Other (non HMO) | Source: Ambulatory Visit | Attending: Internal Medicine | Admitting: Internal Medicine

## 2016-07-14 DIAGNOSIS — Z1231 Encounter for screening mammogram for malignant neoplasm of breast: Secondary | ICD-10-CM

## 2017-02-26 ENCOUNTER — Emergency Department (HOSPITAL_COMMUNITY): Payer: 59

## 2017-02-26 ENCOUNTER — Emergency Department (HOSPITAL_COMMUNITY)
Admission: EM | Admit: 2017-02-26 | Discharge: 2017-02-27 | Disposition: A | Discharge status: Home / Self Care | Payer: 59 | Attending: Emergency Medicine | Admitting: Emergency Medicine

## 2017-02-26 ENCOUNTER — Encounter (HOSPITAL_COMMUNITY): Payer: Self-pay

## 2017-02-26 DIAGNOSIS — Z79899 Other long term (current) drug therapy: Secondary | ICD-10-CM | POA: Insufficient documentation

## 2017-02-26 DIAGNOSIS — F419 Anxiety disorder, unspecified: Secondary | ICD-10-CM | POA: Insufficient documentation

## 2017-02-26 DIAGNOSIS — K224 Dyskinesia of esophagus: Secondary | ICD-10-CM | POA: Diagnosis not present

## 2017-02-26 DIAGNOSIS — R079 Chest pain, unspecified: Secondary | ICD-10-CM | POA: Diagnosis present

## 2017-02-26 LAB — BASIC METABOLIC PANEL
ANION GAP: 8 (ref 5–15)
CALCIUM: 8.3 mg/dL — AB (ref 8.9–10.3)
CO2: 25 mmol/L (ref 22–32)
Chloride: 104 mmol/L (ref 101–111)
Creatinine, Ser: 0.58 mg/dL (ref 0.44–1.00)
GFR calc Af Amer: >60 mL/min (ref >60)
GFR calc non Af Amer: >60 mL/min (ref >60)
GLUCOSE: 103 mg/dL — AB (ref 65–99)
Potassium: 3 mmol/L — ABNORMAL LOW (ref 3.5–5.1)
Sodium: 137 mmol/L (ref 135–145)

## 2017-02-26 LAB — CBC
HEMATOCRIT: 32.1 % — AB (ref 36.0–46.0)
HEMOGLOBIN: 10 g/dL — AB (ref 12.0–15.0)
MCH: 25.1 pg — AB (ref 26.0–34.0)
MCHC: 31.2 g/dL (ref 30.0–36.0)
MCV: 80.5 fL (ref 78.0–100.0)
Platelets: 332 10*3/uL (ref 150–400)
RBC: 3.99 MIL/uL (ref 3.87–5.11)
RDW: 17.8 % — AB (ref 11.5–15.5)
WBC: 4.9 10*3/uL (ref 4.0–10.5)

## 2017-02-26 LAB — I-STAT TROPONIN, ED: TROPONIN I, POC: 0 ng/mL (ref 0.00–0.08)

## 2017-02-26 MED ORDER — GI COCKTAIL ~~LOC~~
30.0000 mL | Freq: Once | ORAL | Status: AC
  Administered 2017-02-26: 30 mL via ORAL
  Filled 2017-02-26: qty 30

## 2017-02-26 NOTE — ED Triage Notes (Signed)
Pt comes via [facility] for CP that started about an hour ago, central sharp, radiation to back and R arm.  Nothing makes it better or worse, diaphoretic, some nausea, no vomiting or SOB. PTA received 324 ASA and 2 nitro pain went from 8 to a 4

## 2017-02-27 NOTE — ED Provider Notes (Signed)
[facility]-EMERGENCY DEPT Provider Note   CSN: [id number] Arrival date & time: 02/26/17  2149     History   Chief Complaint Chief Complaint  Patient presents with  . Chest Pain    HPI Rachael White is a 51 y.o. female.  Patient presents with sharp, intense chest pain that started around 9:00 pm last night (02/26/17) described as pain in the center of her chest that goes straight through to her back. No SOB or worse pain with breathing. She associates worse pain when trying to swallow. No alleviating factors. Pain also radiates to the right arm. She denies fever, cough, nausea, vomiting. No history of CAD. She reports she takes NSAIDs on a daily basis. No known history of ulcers.    The history is provided by the patient. No language interpreter was used.    Past Medical History:  Diagnosis Date  . Allergy   . Anxiety     Patient Active Problem List   Diagnosis Date Noted  . Fitting and adjustment of gastric lap band 07/23/2013  . 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 BREAST REDUCTION SURGERY    . COSMETIC SURGERY    . EVALUATION UNDER ANESTHESIA WITH HEMORRHOIDECTOMY N/A 04/29/2015   Procedure: EXAM UNDER ANESTHESIA WITH INTERNAL HEMORRHOID BANDING AND EXTERNAL HEMORRHOIDECTOMY;  Surgeon: Luretha Murphy, MD;  Location: [facility];  Service: General;  Laterality: N/A;  . GASTRIC BANDING PORT REVISION  09/04/2012   Procedure: GASTRIC BANDING PORT REVISION;  Surgeon: Valarie Merino, MD;  Location: [facility];  Service: General;  Laterality: N/A;  Lap Band Port Revision  . KNEE ARTHROSCOPY     bilaterial  . LAPAROSCOPIC GASTRIC BANDING  With vagotomy July 2007   Lapband vagotomy study patient  . LAPAROSCOPIC REVISION OF GASTRIC BAND N/A 05/28/2013   Procedure: LAPAROSCOPIC REVISION OF GASTRIC BAND;  Surgeon: Valarie Merino, MD;  Location: [facility];  Service: General;  Laterality: N/A;  REMOVAL OF GASTRIC  BAND AND INSERTION OF NEW AP BAND with MESH  . NASAL SINUS SURGERY    . TONSILLECTOMY    . WRIST GANGLION EXCISION      OB History    No data available       Home Medications    Prior to Admission medications   Medication Sig Start Date End Date Taking? Authorizing Provider  amphetamine-dextroamphetamine (ADDERALL) 30 MG tablet TAKE 1 TABLET BY MOUTH IN THE MORNING AND 1 TAB AT NOON 03/27/15  Yes [provider]  diazepam (VALIUM) 5 MG tablet Take 5 mg by mouth every 8 (eight) hours as needed for anxiety.  04/09/12  Yes [provider]  venlafaxine XR (EFFEXOR-XR) 75 MG 24 hr capsule Take 75 mg by mouth daily. 01/15/17  Yes [provider]  aluminum & magnesium hydroxide-simethicone (MYLANTA) 500-450-40 MG/5ML suspension Take 15 mLs by mouth every 6 (six) hours as needed. For stomach    [provider]  ketotifen (ZADITOR) 0.025 % ophthalmic solution Place 1-2 drops into both eyes 2 (two) times daily as needed (allergies.).    [provider]  naproxen sodium (ANAPROX) 220 MG tablet Take 440 mg by mouth 2 (two) times daily as needed (pain.).    [provider]  oseltamivir (TAMIFLU) 75 MG capsule Take 1 capsule (75 mg total) by mouth 2 (two) times daily. Patient not taking: Reported on 04/20/2015 12/09/14   Wallis Bamberg, PA-C  oxyCODONE (OXY IR/ROXICODONE) 5  MG immediate release tablet Take 1 tablet (5 mg total) by mouth every 4 (four) hours as needed for severe pain or breakthrough pain. Patient not taking: Reported on 02/26/2017 04/29/15   Luretha MurphyMartin, Matthew, MD    Family History Family History  Problem Relation Age of Onset  . Cancer Father        prostate  . Diabetes Mother   . Heart disease Mother   . Diabetes Brother   . Diabetes Maternal Grandmother     Social History Social History  Substance Use Topics  . Smoking status: Never Smoker  . Smokeless tobacco: Never Used  . Alcohol use 0.6 - 1.8 oz/week    1 - 3 Glasses of  wine per week     Comment: wine occ     Allergies   Penicillins   Review of Systems Review of Systems  Constitutional: Negative for chills and fever.  Respiratory: Negative.  Negative for cough and shortness of breath.   Cardiovascular: Positive for chest pain.  Gastrointestinal: Negative.  Negative for abdominal pain, nausea and vomiting.  Musculoskeletal: Positive for back pain (CP through to the back.).  Skin: Negative.   Neurological: Negative.  Negative for weakness and light-headedness.     Physical Exam Updated Vital Signs BP (!) 134/96   Pulse 79   Temp 98.6 F (37 C) (Oral)   Resp 18   Ht 5\' 6"  (1.676 m)   Wt 93.9 kg (207 lb)   SpO2 99%   BMI 33.41 kg/m   Physical Exam  Constitutional: She is oriented to person, place, and time. She appears well-developed and well-nourished.  HENT:  Head: Normocephalic.  Neck: Normal range of motion. Neck supple.  Cardiovascular: Normal rate and regular rhythm.   No murmur heard. Pulmonary/Chest: Effort normal and breath sounds normal. She has no wheezes. She has no rales. She exhibits no tenderness.  Abdominal: Soft. Bowel sounds are normal. There is no tenderness. There is no rebound and no guarding.  Musculoskeletal: Normal range of motion. She exhibits no edema.  Neurological: She is alert and oriented to person, place, and time.  Skin: Skin is warm and dry. No rash noted.  Psychiatric: She has a normal mood and affect.     ED Treatments / Results  Labs (all labs ordered are listed, but only abnormal results are displayed) Labs Reviewed  BASIC METABOLIC PANEL - Abnormal; Notable for the following:       Result Value   Potassium 3.0 (*)    Glucose, Bld 103 (*)    BUN <5 (*)    Calcium 8.3 (*)    All other components within normal limits  CBC - Abnormal; Notable for the following:    Hemoglobin 10.0 (*)    HCT 32.1 (*)    MCH 25.1 (*)    RDW 17.8 (*)    All other components within normal limits  I-STAT  TROPOININ, ED    EKG  EKG Interpretation  Date/Time:  Sunday February 26 2017 21:55:59 EDT Ventricular Rate:  77 PR Interval:    QRS Duration: 92 QT Interval:  407 QTC Calculation: 461 R Axis:   47 Text Interpretation:  Sinus rhythm No significant change was found Confirmed by Azalia Bilisampos, Kevin ([id number]) on 02/26/2017 11:21:46 PM       Radiology Dg Chest 2 View  Result Date: 02/26/2017 CLINICAL DATA:  Chest pain. EXAM: CHEST  2 VIEW COMPARISON:  February 11, 2012 FINDINGS: The heart size and mediastinal contours are within  normal limits. Both lungs are clear. The visualized skeletal structures are unremarkable. IMPRESSION: No active cardiopulmonary disease. Electronically Signed   By: Gerome Sam III M.D   On: 02/26/2017 22:47    Procedures Procedures (including critical care time)  Medications Ordered in ED Medications  gi cocktail (Maalox,Lidocaine,Donnatal) (30 mLs Oral Given 02/26/17 2340)     Initial Impression / Assessment and Plan / ED Course  I have reviewed the triage vital signs and the nursing notes.  Pertinent labs & imaging results that were available during my care of the patient were reviewed by me and considered in my medical decision making (see chart for details).     Patient presents with sharp chest pain, radiation to back and right arm. No h/o CAD. No SOB or nausea.   EKG unremarkable. Troponin negative. GI cocktail provides complete relief of symptoms. Discussed with Dr. Patria Mane. Doubt ACS. Symptoms and course c/w GI etiology, possible esophageal spasm. She is felt stable for discharge home.   Final Clinical Impressions(s) / ED Diagnoses   Final diagnoses:  None   1. Esophageal spasm  New Prescriptions New Prescriptions   No medications on file     Danne Harbor 03/04/17 1610    Azalia Bilis, MD 03/04/17 [phone]

## 2017-06-28 ENCOUNTER — Other Ambulatory Visit: Payer: Self-pay | Admitting: Internal Medicine

## 2017-06-28 DIAGNOSIS — Z139 Encounter for screening, unspecified: Secondary | ICD-10-CM

## 2017-07-26 ENCOUNTER — Ambulatory Visit: Payer: 59

## 2017-08-23 ENCOUNTER — Ambulatory Visit: Payer: 59

## 2020-03-10 NOTE — Progress Notes (Deleted)
This visit occurred during the SARS-CoV-2 public health emergency.  Safety protocols were in place, including screening questions prior to the visit, additional usage of staff PPE, and extensive cleaning of exam room while observing appropriate contact time as indicated for disinfecting solutions.  Subjective:     Patient ID: Rachael White , female    DOB: 1966-01-21 , 54 y.o.   MRN: [id number]   Chief Complaint  Patient presents with  . Annual Exam    HPI  HPI   Past Medical History:  Diagnosis Date  . Allergy   . Anxiety      Family History  Problem Relation Age of Onset  . Cancer Father        prostate  . Diabetes Mother   . Heart disease Mother   . Diabetes Brother   . Diabetes Maternal Grandmother      Current Outpatient Medications:  .  aluminum & magnesium hydroxide-simethicone (MYLANTA) 500-450-40 MG/5ML suspension, Take 15 mLs by mouth every 6 (six) hours as needed. For stomach, Disp: , Rfl:  .  amphetamine-dextroamphetamine (ADDERALL) 30 MG tablet, TAKE 1 TABLET BY MOUTH IN THE MORNING AND 1 TAB AT NOON, Disp: , Rfl: 0 .  diazepam (VALIUM) 5 MG tablet, Take 5 mg by mouth every 8 (eight) hours as needed for anxiety. , Disp: , Rfl:  .  ketotifen (ZADITOR) 0.025 % ophthalmic solution, Place 1-2 drops into both eyes 2 (two) times daily as needed (allergies.)., Disp: , Rfl:  .  naproxen sodium (ANAPROX) 220 MG tablet, Take 440 mg by mouth 2 (two) times daily as needed (pain.)., Disp: , Rfl:  .  oseltamivir (TAMIFLU) 75 MG capsule, Take 1 capsule (75 mg total) by mouth 2 (two) times daily. (Patient not taking: Reported on 04/20/2015), Disp: 10 capsule, Rfl: 0 .  oxyCODONE (OXY IR/ROXICODONE) 5 MG immediate release tablet, Take 1 tablet (5 mg total) by mouth every 4 (four) hours as needed for severe pain or breakthrough pain. (Patient not taking: Reported on 02/26/2017), Disp: 40 tablet, Rfl: 0 .  venlafaxine XR (EFFEXOR-XR) 75 MG 24 hr capsule, Take 75 mg by mouth  daily., Disp: , Rfl: 12   Allergies  Allergen Reactions  . Penicillins Rash    Unknown childhood allergy.  Has patient had a PCN reaction causing immediate rash, facial/tongue/throat swelling, SOB or lightheadedness with hypotension: No Has patient had a PCN reaction causing severe rash involving mucus membranes or skin necrosis: No Has patient had a PCN reaction that required hospitalization: No Has patient had a PCN reaction occurring within the last 10 years: No If all of the above answers are "NO", then may proceed with Cephalosporin use.     Review of Systems   There were no vitals filed for this visit. There is no height or weight on file to calculate BMI.   Objective:  Physical Exam      Assessment And Plan:     1. Encounter for general adult medical examination w/o abnormal findings ***      ***  Patient was given opportunity to ask questions. Patient verbalized understanding of the plan and was able to repeat key elements of the plan. All questions were answered to their satisfaction.  [address] [place], CMA   I, [place], [place], have reviewed all documentation for this visit. The documentation on 03/10/20 for the exam, diagnosis, procedures, and orders are all accurate and complete.  THE PATIENT IS ENCOURAGED TO PRACTICE SOCIAL DISTANCING DUE TO  THE COVID-19 PANDEMIC.

## 2020-03-11 ENCOUNTER — Encounter: Payer: Managed Care, Other (non HMO) | Admitting: Nurse Practitioner

## 2020-03-11 NOTE — Progress Notes (Deleted)
This visit occurred during the SARS-CoV-2 public health emergency.  Safety protocols were in place, including screening questions prior to the visit, additional usage of staff PPE, and extensive cleaning of exam room while observing appropriate contact time as indicated for disinfecting solutions.  Subjective:     Patient ID: Rachael White , female    DOB: 1965-11-18 , 54 y.o.   MRN: [id number]   Chief Complaint  Patient presents with  . Annual Exam    HPI  The patient states she uses {contraceptive methods:5051} for birth control. Last LMP was No LMP recorded.. {Dysmenorrhea-menorrhagia:21918}. Negative for: breast discharge, breast lump(s), breast pain and breast self exam. Associated symptoms include abnormal vaginal bleeding. Pertinent negatives include abnormal bleeding (hematology), anxiety, decreased libido, depression, difficulty falling sleep, dyspareunia, history of infertility, nocturia, sexual dysfunction, sleep disturbances, urinary incontinence, urinary urgency, vaginal discharge and vaginal itching. Diet regular.The patient states her exercise level is      The patient's tobacco use is:  Social History   Tobacco Use  Smoking Status Never Smoker  Smokeless Tobacco Never Used   She has been exposed to passive smoke. The patient's alcohol use is:  Social History   Substance and Sexual Activity  Alcohol Use Yes  . Alcohol/week: 1.0 - 3.0 standard drink  . Types: 1 - 3 Glasses of wine per week   Comment: wine occ  . Additional information: Last pap ***, next one scheduled for ***.  HPI   Past Medical History:  Diagnosis Date  . Allergy   . Anxiety      Family History  Problem Relation Age of Onset  . Cancer Father        prostate  . Diabetes Mother   . Heart disease Mother   . Diabetes Brother   . Diabetes Maternal Grandmother      Current Outpatient Medications:  .  aluminum & magnesium hydroxide-simethicone (MYLANTA) 500-450-40 MG/5ML suspension,  Take 15 mLs by mouth every 6 (six) hours as needed. For stomach, Disp: , Rfl:  .  amphetamine-dextroamphetamine (ADDERALL) 30 MG tablet, TAKE 1 TABLET BY MOUTH IN THE MORNING AND 1 TAB AT NOON, Disp: , Rfl: 0 .  diazepam (VALIUM) 5 MG tablet, Take 5 mg by mouth every 8 (eight) hours as needed for anxiety. , Disp: , Rfl:  .  ketotifen (ZADITOR) 0.025 % ophthalmic solution, Place 1-2 drops into both eyes 2 (two) times daily as needed (allergies.)., Disp: , Rfl:  .  naproxen sodium (ANAPROX) 220 MG tablet, Take 440 mg by mouth 2 (two) times daily as needed (pain.)., Disp: , Rfl:  .  oseltamivir (TAMIFLU) 75 MG capsule, Take 1 capsule (75 mg total) by mouth 2 (two) times daily. (Patient not taking: Reported on 04/20/2015), Disp: 10 capsule, Rfl: 0 .  oxyCODONE (OXY IR/ROXICODONE) 5 MG immediate release tablet, Take 1 tablet (5 mg total) by mouth every 4 (four) hours as needed for severe pain or breakthrough pain. (Patient not taking: Reported on 02/26/2017), Disp: 40 tablet, Rfl: 0 .  venlafaxine XR (EFFEXOR-XR) 75 MG 24 hr capsule, Take 75 mg by mouth daily., Disp: , Rfl: 12   Allergies  Allergen Reactions  . Penicillins Rash    Unknown childhood allergy.  Has patient had a PCN reaction causing immediate rash, facial/tongue/throat swelling, SOB or lightheadedness with hypotension: No Has patient had a PCN reaction causing severe rash involving mucus membranes or skin necrosis: No Has patient had a PCN reaction that required hospitalization: No Has patient  had a PCN reaction occurring within the last 10 years: No If all of the above answers are "NO", then may proceed with Cephalosporin use.          Review of Systems   There were no vitals filed for this visit. There is no height or weight on file to calculate BMI.   Objective:  Physical Exam      Assessment And Plan:     Problem List Items Addressed This Visit    None    Visit Diagnoses    Encounter for general adult medical  examination w/o abnormal findings    -  Primary         Patient was given opportunity to ask questions. Patient verbalized understanding of the plan and was able to repeat key elements of the plan. All questions were answered to their satisfaction.  Arnette Felts, FNP   I, Arnette Felts, FNP, have reviewed all documentation for this visit. The documentation on 03/11/20 for the exam, diagnosis, procedures, and orders are all accurate and complete.  THE PATIENT IS ENCOURAGED TO PRACTICE SOCIAL DISTANCING DUE TO THE COVID-19 PANDEMIC.

## 2020-07-20 ENCOUNTER — Encounter (HOSPITAL_COMMUNITY): Payer: Self-pay | Admitting: Urgent Care

## 2020-07-20 ENCOUNTER — Ambulatory Visit (HOSPITAL_COMMUNITY)
Admission: EM | Admit: 2020-07-20 | Discharge: 2020-07-20 | Disposition: A | Discharge status: Home / Self Care | Payer: No Typology Code available for payment source

## 2020-07-20 ENCOUNTER — Other Ambulatory Visit: Payer: Self-pay

## 2020-07-20 DIAGNOSIS — Z8249 Family history of ischemic heart disease and other diseases of the circulatory system: Secondary | ICD-10-CM

## 2020-07-20 DIAGNOSIS — M7989 Other specified soft tissue disorders: Secondary | ICD-10-CM

## 2020-07-20 NOTE — Discharge Instructions (Signed)
Please report to the [facility] tomorrow at 8:40am. Let them know that you are there for an outpatient ultrasound to rule out a blood clot in your leg and they will direct you to the right department.   Do not use any nonsteroidal anti-inflammatories (NSAIDs) like ibuprofen, Motrin, naproxen, Aleve, etc. which are all available over-the-counter.  Please just use Tylenol at a dose of 500mg -650mg  once every 6 hours as needed for your aches, pains, fevers.

## 2020-07-20 NOTE — ED Provider Notes (Signed)
[facility]   MRN: [id number] DOB: 01/15/1966  Subjective:   Rachael White is a 54 y.o. female presenting for 3-day history of acute onset right lower leg pain that initially started at the top of her calf/lower portion of posterior knee and is now progressed to her mid calf.  Patient states that she is concerned about a blood clot, has a family history of this and spent a lot of time at the airport and on the plane recently.  She states intermittent tingling and cold sensation of her toes.  Has a history of sciatica but denies any particular sciatica type symptoms now.  Has not taken any medications for relief.  No current facility-administered medications for this encounter.  Current Outpatient Medications:  .  aluminum & magnesium hydroxide-simethicone (MYLANTA) 500-450-40 MG/5ML suspension, Take 15 mLs by mouth every 6 (six) hours as needed. For stomach, Disp: , Rfl:  .  amphetamine-dextroamphetamine (ADDERALL) 30 MG tablet, TAKE 1 TABLET BY MOUTH IN THE MORNING AND 1 TAB AT NOON, Disp: , Rfl: 0 .  diazepam (VALIUM) 5 MG tablet, Take 5 mg by mouth every 8 (eight) hours as needed for anxiety. , Disp: , Rfl:  .  ketotifen (ZADITOR) 0.025 % ophthalmic solution, Place 1-2 drops into both eyes 2 (two) times daily as needed (allergies.)., Disp: , Rfl:  .  naproxen sodium (ANAPROX) 220 MG tablet, Take 440 mg by mouth 2 (two) times daily as needed (pain.)., Disp: , Rfl:  .  oseltamivir (TAMIFLU) 75 MG capsule, Take 1 capsule (75 mg total) by mouth 2 (two) times daily. (Patient not taking: Reported on 04/20/2015), Disp: 10 capsule, Rfl: 0 .  oxyCODONE (OXY IR/ROXICODONE) 5 MG immediate release tablet, Take 1 tablet (5 mg total) by mouth every 4 (four) hours as needed for severe pain or breakthrough pain. (Patient not taking: Reported on 02/26/2017), Disp: 40 tablet, Rfl: 0 .  venlafaxine XR (EFFEXOR-XR) 75 MG 24 hr capsule, Take 75 mg by mouth daily., Disp: , Rfl: 12   Allergies    Allergen Reactions  . Penicillins Rash    Unknown childhood allergy.  Has patient had a PCN reaction causing immediate rash, facial/tongue/throat swelling, SOB or lightheadedness with hypotension: No Has patient had a PCN reaction causing severe rash involving mucus membranes or skin necrosis: No Has patient had a PCN reaction that required hospitalization: No Has patient had a PCN reaction occurring within the last 10 years: No If all of the above answers are "NO", then may proceed with Cephalosporin use.    Past Medical History:  Diagnosis Date  . Allergy   . Anxiety      Past Surgical History:  Procedure Laterality Date  . BREAST REDUCTION SURGERY    . COSMETIC SURGERY    . EVALUATION UNDER ANESTHESIA WITH HEMORRHOIDECTOMY N/A 04/29/2015   Procedure: EXAM UNDER ANESTHESIA WITH INTERNAL HEMORRHOID BANDING AND EXTERNAL HEMORRHOIDECTOMY;  Surgeon: Luretha Murphy, MD;  Location: [facility];  Service: General;  Laterality: N/A;  . GASTRIC BANDING PORT REVISION  09/04/2012   Procedure: GASTRIC BANDING PORT REVISION;  Surgeon: Valarie Merino, MD;  Location: [facility];  Service: General;  Laterality: N/A;  Lap Band Port Revision  . KNEE ARTHROSCOPY     bilaterial  . LAPAROSCOPIC GASTRIC BANDING  With vagotomy July 2007   Lapband vagotomy study patient  . LAPAROSCOPIC REVISION OF GASTRIC BAND N/A 05/28/2013   Procedure: LAPAROSCOPIC REVISION OF GASTRIC BAND;  Surgeon: Valarie Merino, MD;  Location: [facility];  Service: General;  Laterality: N/A;  REMOVAL OF GASTRIC BAND AND INSERTION OF NEW AP BAND with MESH  . NASAL SINUS SURGERY    . TONSILLECTOMY    . WRIST GANGLION EXCISION      Family History  Problem Relation Age of Onset  . Cancer Father        prostate  . Diabetes Mother   . Heart disease Mother   . Diabetes Brother   . Diabetes Maternal Grandmother     Social History   Tobacco Use  . Smoking status: Never Smoker  . Smokeless tobacco: Never Used  Substance Use Topics  .  Alcohol use: Yes    Alcohol/week: 1.0 - 3.0 standard drink    Types: 1 - 3 Glasses of wine per week    Comment: wine occ  . Drug use: No    ROS   Objective:   Vitals: There were no vitals taken for this visit.  Physical Exam Constitutional:      General: She is not in acute distress.    Appearance: Normal appearance. She is well-developed. She is obese. She is not ill-appearing, toxic-appearing or diaphoretic.  HENT:     Head: Normocephalic and atraumatic.     Nose: Nose normal.     Mouth/Throat:     Mouth: Mucous membranes are moist.     Pharynx: Oropharynx is clear.  Eyes:     General: No scleral icterus.       Right eye: No discharge.        Left eye: No discharge.     Extraocular Movements: Extraocular movements intact.     Conjunctiva/sclera: Conjunctivae normal.     Pupils: Pupils are equal, round, and reactive to light.  Cardiovascular:     Rate and Rhythm: Normal rate.  Pulmonary:     Effort: Pulmonary effort is normal.  Musculoskeletal:     Right knee: No swelling, deformity, effusion, erythema, ecchymosis, lacerations, bony tenderness or crepitus. Normal range of motion. Tenderness (Slight tenderness posteriorly over the lower side) present. Normal alignment and normal patellar mobility.     Right lower leg: Tenderness (Positive Homans' sign) present. No swelling, deformity, lacerations or bony tenderness. No edema.     Left lower leg: No swelling, deformity, lacerations, tenderness or bony tenderness. No edema.  Skin:    General: Skin is warm and dry.  Neurological:     General: No focal deficit present.     Mental Status: She is alert and oriented to person, place, and time.  Psychiatric:        Mood and Affect: Mood normal.        Behavior: Behavior normal.        Thought Content: Thought content normal.        Judgment: Judgment normal.     Assessment and Plan :   PDMP not reviewed this encounter.  1. Right leg swelling   2. Family history of  DVT     We will pursue an ultrasound to rule out a DVT given her family history and current symptoms that.  This was scheduled for tomorrow at 9 AM through [facility].  Recommended using Tylenol and avoiding NSAIDs.  We will follow-up with patient's results tomorrow. Counseled patient on potential for adverse effects with medications prescribed/recommended today, ER and return-to-clinic precautions discussed, patient verbalized understanding.    Wallis Bamberg, [place] 07/20/20 3614

## 2020-07-20 NOTE — ED Triage Notes (Signed)
Onset Friday night of leg pain and swelling.  Pain is behind right knee and radiating into calf of leg.  Toes are feeling tingling/cold Friday and Saturday patient spent a great deal of time sitting in airport and on the plane  Father had a history of DVT.  Patient is concerned for this

## 2020-07-21 ENCOUNTER — Other Ambulatory Visit (HOSPITAL_COMMUNITY): Payer: Self-pay | Admitting: Urgent Care

## 2020-07-21 ENCOUNTER — Ambulatory Visit (HOSPITAL_COMMUNITY)
Admission: RE | Admit: 2020-07-21 | Discharge: 2020-07-21 | Disposition: A | Discharge status: Home / Self Care | Payer: No Typology Code available for payment source | Source: Ambulatory Visit | Attending: Urgent Care | Admitting: Urgent Care

## 2020-07-21 ENCOUNTER — Ambulatory Visit (HOSPITAL_COMMUNITY): Payer: No Typology Code available for payment source | Attending: Urgent Care

## 2020-07-21 DIAGNOSIS — R600 Localized edema: Secondary | ICD-10-CM | POA: Diagnosis not present

## 2020-07-21 NOTE — Progress Notes (Signed)
Right lower extremity venous duplex has been completed. Preliminary results can be found in CV Proc through chart review.  Results were given to [facility] PA.  07/21/20 9:31 AM Olen Cordial RVT

## 2020-08-23 ENCOUNTER — Other Ambulatory Visit: Payer: Self-pay

## 2020-08-23 ENCOUNTER — Emergency Department (HOSPITAL_BASED_OUTPATIENT_CLINIC_OR_DEPARTMENT_OTHER)
Admission: EM | Admit: 2020-08-23 | Discharge: 2020-08-23 | Disposition: A | Discharge status: Home / Self Care | Payer: No Typology Code available for payment source | Attending: Emergency Medicine | Admitting: Emergency Medicine

## 2020-08-23 ENCOUNTER — Encounter (HOSPITAL_BASED_OUTPATIENT_CLINIC_OR_DEPARTMENT_OTHER): Payer: Self-pay

## 2020-08-23 DIAGNOSIS — R0981 Nasal congestion: Secondary | ICD-10-CM | POA: Diagnosis present

## 2020-08-23 DIAGNOSIS — U071 COVID-19: Secondary | ICD-10-CM | POA: Diagnosis not present

## 2020-08-23 LAB — RESP PANEL BY RT-PCR (FLU A&B, COVID) ARPGX2
Influenza A by PCR: NEGATIVE
Influenza B by PCR: NEGATIVE
SARS Coronavirus 2 by RT PCR: POSITIVE — AB

## 2020-08-23 NOTE — ED Notes (Signed)
Pt covid positive. Dr. Adela Lank aware.

## 2020-08-23 NOTE — Discharge Instructions (Signed)
Home to quarantine. Motrin and Tylenol as needed as directed. Mucines as needed as directed. Contact the COVID clinic if you would like to discuss antibody infusion although there are strict criteria for qualification.

## 2020-08-23 NOTE — ED Triage Notes (Addendum)
Pt states having sinus congestion and headache, decreased appetite, change in sense of smell for the past 5 or so days. Fatigued, denies fever, no vomiting/diarrhea. Received covid booster on Tuesday, states she had some sinus pressure prior to vaccine. Hx of sinus infections.

## 2020-08-23 NOTE — ED Provider Notes (Signed)
[facility] EMERGENCY DEPARTMENT Provider Note   CSN: [id number] Arrival date & time: 08/23/20  1805     History Chief Complaint  Patient presents with  . Headache  . Nasal Congestion    Rachael White is a 54 y.o. female.  54 year old female with complaint of sinus pressure and congestion onset Tuesday last week. Reports max temp at home of 99. No known sick contacts, is vaccinated and received booster last week. No history of asthma or chronic lung disease, is a non smoker.  Rachael White was evaluated in Emergency Department on 08/23/2020 for the symptoms described in the history of present illness. She was evaluated in the context of the global COVID-19 pandemic, which necessitated consideration that the patient might be at risk for infection with the SARS-CoV-2 virus that causes COVID-19. Institutional protocols and algorithms that pertain to the evaluation of patients at risk for COVID-19 are in a state of rapid change based on information released by regulatory bodies including the CDC and federal and state organizations. These policies and algorithms were followed during the patient's care in the ED.         Past Medical History:  Diagnosis Date  . Allergy   . Anxiety     Patient Active Problem List   Diagnosis Date Noted  . Fitting and adjustment of gastric lap band 07/23/2013  . 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 BREAST REDUCTION SURGERY    . COSMETIC SURGERY    . EVALUATION UNDER ANESTHESIA WITH HEMORRHOIDECTOMY N/A 04/29/2015   Procedure: EXAM UNDER ANESTHESIA WITH INTERNAL HEMORRHOID BANDING AND EXTERNAL HEMORRHOIDECTOMY;  Surgeon: Luretha Ganesh Deeg, MD;  Location: [facility];  Service: General;  Laterality: N/A;  . GASTRIC BANDING PORT REVISION  09/04/2012   Procedure: GASTRIC BANDING PORT REVISION;  Surgeon: Valarie Merino, MD;  Location: [facility];  Service:  General;  Laterality: N/A;  Lap Band Port Revision  . KNEE ARTHROSCOPY     bilaterial  . LAPAROSCOPIC GASTRIC BANDING  With vagotomy July 2007   Lapband vagotomy study patient  . LAPAROSCOPIC REVISION OF GASTRIC BAND N/A 05/28/2013   Procedure: LAPAROSCOPIC REVISION OF GASTRIC BAND;  Surgeon: Valarie Merino, MD;  Location: [facility];  Service: General;  Laterality: N/A;  REMOVAL OF GASTRIC BAND AND INSERTION OF NEW AP BAND with MESH  . NASAL SINUS SURGERY    . TONSILLECTOMY    . WRIST GANGLION EXCISION       OB History   No obstetric history on file.     Family History  Problem Relation Age of Onset  . Cancer Father        prostate  . Diabetes Mother   . Heart disease Mother   . Diabetes Brother   . Diabetes Maternal Grandmother     Social History   Tobacco Use  . Smoking status: Never Smoker  . Smokeless tobacco: Never Used  Substance Use Topics  . Alcohol use: Yes    Alcohol/week: 1.0 - 3.0 standard drink    Types: 1 - 3 Glasses of wine per week    Comment: wine occ  . Drug use: No    Home Medications Prior to Admission medications   Medication Sig Start Date End Date Taking? Authorizing Provider  aluminum & magnesium hydroxide-simethicone (MYLANTA) 500-450-40 MG/5ML suspension Take 15 mLs by mouth every 6 (six) hours as needed. For stomach  [provider]  amphetamine-dextroamphetamine (ADDERALL) 30 MG tablet TAKE 1 TABLET BY MOUTH IN THE MORNING AND 1 TAB AT NOON 03/27/15   [provider]  diazepam (VALIUM) 5 MG tablet Take 5 mg by mouth every 8 (eight) hours as needed for anxiety.  04/09/12   [provider]  ketotifen (ZADITOR) 0.025 % ophthalmic solution Place 1-2 drops into both eyes 2 (two) times daily as needed (allergies.).    [provider]  naproxen sodium (ANAPROX) 220 MG tablet Take 440 mg by mouth 2 (two) times daily as needed (pain.).    [provider]  oseltamivir (TAMIFLU) 75 MG capsule Take 1 capsule  (75 mg total) by mouth 2 (two) times daily. Patient not taking: Reported on 04/20/2015 12/09/14   Wallis Bamberg, PA-C  oxyCODONE (OXY IR/ROXICODONE) 5 MG immediate release tablet Take 1 tablet (5 mg total) by mouth every 4 (four) hours as needed for severe pain or breakthrough pain. Patient not taking: Reported on 02/26/2017 04/29/15   Luretha Ronav Furney, MD  venlafaxine XR (EFFEXOR-XR) 75 MG 24 hr capsule Take 75 mg by mouth daily. 01/15/17   [provider]    Allergies    Penicillins  Review of Systems   Review of Systems  Constitutional: Negative for chills and fever.  HENT: Positive for congestion, sinus pressure and sinus pain. Negative for ear pain and sore throat.   Respiratory: Negative for cough.   Gastrointestinal: Negative for diarrhea and vomiting.  Musculoskeletal: Negative for arthralgias and myalgias.  Skin: Negative for rash and wound.  Allergic/Immunologic: Negative for immunocompromised state.  All other systems reviewed and are negative.   Physical Exam Updated Vital Signs BP (!) 129/91 (BP Location: Right Arm)   Pulse 92   Temp 99.5 F (37.5 C) (Oral)   Resp 18   Ht 5' 6.5" (1.689 m)   Wt 104.3 kg   SpO2 98%   BMI 36.57 kg/m   Physical Exam Vitals and nursing note reviewed.  Constitutional:      General: She is not in acute distress.    Appearance: She is well-developed and well-nourished. She is not diaphoretic.  HENT:     Head: Normocephalic and atraumatic.  Eyes:     Conjunctiva/sclera: Conjunctivae normal.  Cardiovascular:     Rate and Rhythm: Normal rate and regular rhythm.     Heart sounds: Normal heart sounds.  Pulmonary:     Effort: Pulmonary effort is normal.     Breath sounds: Normal breath sounds.  Musculoskeletal:     Cervical back: Normal range of motion and neck supple.  Lymphadenopathy:     Cervical: No cervical adenopathy.  Skin:    General: Skin is warm and dry.     Findings: No erythema or rash.  Neurological:     Mental  Status: She is alert and oriented to person, place, and time.  Psychiatric:        Mood and Affect: Mood and affect normal.        Behavior: Behavior normal.     ED Results / Procedures / Treatments   Labs (all labs ordered are listed, but only abnormal results are displayed) Labs Reviewed  RESP PANEL BY RT-PCR (FLU A&B, COVID) ARPGX2 - Abnormal; Notable for the following components:      Result Value   SARS Coronavirus 2 by RT PCR POSITIVE (*)    All other components within normal limits    EKG None  Radiology No results found.  Procedures  Procedures (including critical care time)  Medications Ordered in ED Medications - No data to display  ED Course  I have reviewed the triage vital signs and the nursing notes.  Pertinent labs & imaging results that were available during my care of the patient were reviewed by me and considered in my medical decision making (see chart for details).  Clinical Course as of 08/23/20 1949  Wynelle Link Aug 23, 2020  1946 54 yo female with sinus congestion and pressure, found to be COVID + today. Vitals reassuring, patient is well appearing. Discussed results with patient who is tearful, reports a COVID related family member death last year.  Offered reassurance, recommend Motrin/Tylenol, Mucinex, contact COVID clinic for further questions, return to ER for severe or concerning symptoms.  [LM]    Clinical Course User Index [LM] Alden Hipp   MDM Rules/Calculators/A&P                          Final Clinical Impression(s) / ED Diagnoses Final diagnoses:  COVID-19    Rx / DC Orders ED Discharge Orders    None       Jeannie Fend, PA-C 08/23/20 1949    Melene Plan, DO 08/23/20 2309

## 2020-08-27 ENCOUNTER — Other Ambulatory Visit: Payer: No Typology Code available for payment source

## 2020-08-27 ENCOUNTER — Encounter (INDEPENDENT_AMBULATORY_CARE_PROVIDER_SITE_OTHER): Payer: Self-pay

## 2020-08-27 DIAGNOSIS — Z20822 Contact with and (suspected) exposure to covid-19: Secondary | ICD-10-CM

## 2020-08-29 LAB — SARS-COV-2, NAA 2 DAY TAT

## 2020-08-29 LAB — NOVEL CORONAVIRUS, NAA: SARS-CoV-2, NAA: NOT DETECTED

## 2020-09-17 ENCOUNTER — Other Ambulatory Visit: Payer: Self-pay | Admitting: Internal Medicine

## 2020-09-17 DIAGNOSIS — Z1231 Encounter for screening mammogram for malignant neoplasm of breast: Secondary | ICD-10-CM

## 2020-10-07 ENCOUNTER — Ambulatory Visit
Admission: RE | Admit: 2020-10-07 | Discharge: 2020-10-07 | Disposition: A | Discharge status: Home / Self Care | Payer: No Typology Code available for payment source | Source: Ambulatory Visit

## 2020-10-07 ENCOUNTER — Other Ambulatory Visit: Payer: Self-pay

## 2020-10-07 DIAGNOSIS — Z1231 Encounter for screening mammogram for malignant neoplasm of breast: Secondary | ICD-10-CM

## 2021-02-26 IMAGING — MG MM DIGITAL SCREENING BILAT W/ TOMO AND CAD
8 of 14 series · 8 of 40 positions shown · non-contrast
Comparison: Previous exam(s).

ACR Breast Density Category a: The breast tissue is almost entirely
fatty.

CLINICAL DATA: Screening.

EXAM:
DIGITAL SCREENING BILATERAL MAMMOGRAM WITH TOMOSYNTHESIS AND CAD
TECHNIQUE: Bilateral screening digital craniocaudal and mediolateral oblique
mammograms were obtained. Bilateral screening digital breast
tomosynthesis was performed. The images were evaluated with
computer-aided detection.

[R MLO synth-2D (1 of 2)]
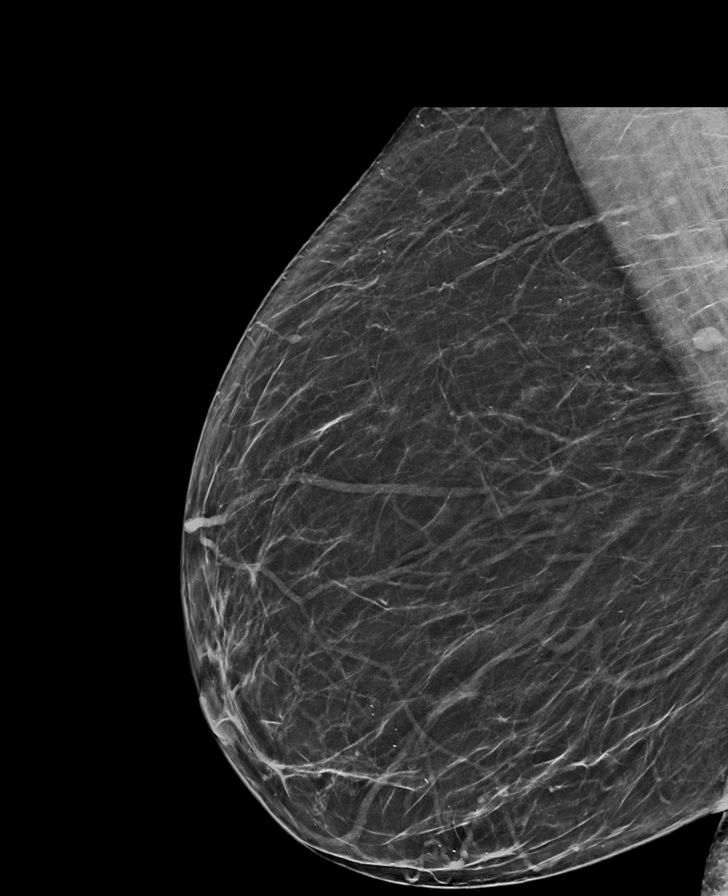

[R CC synth-2D]
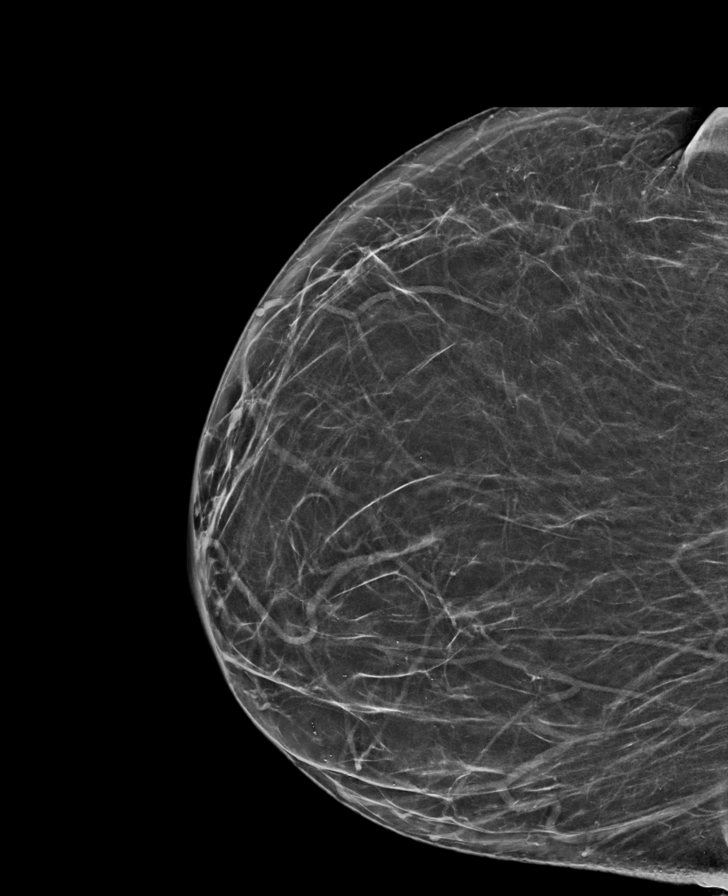

[L MLO synth-2D (1 of 2)]
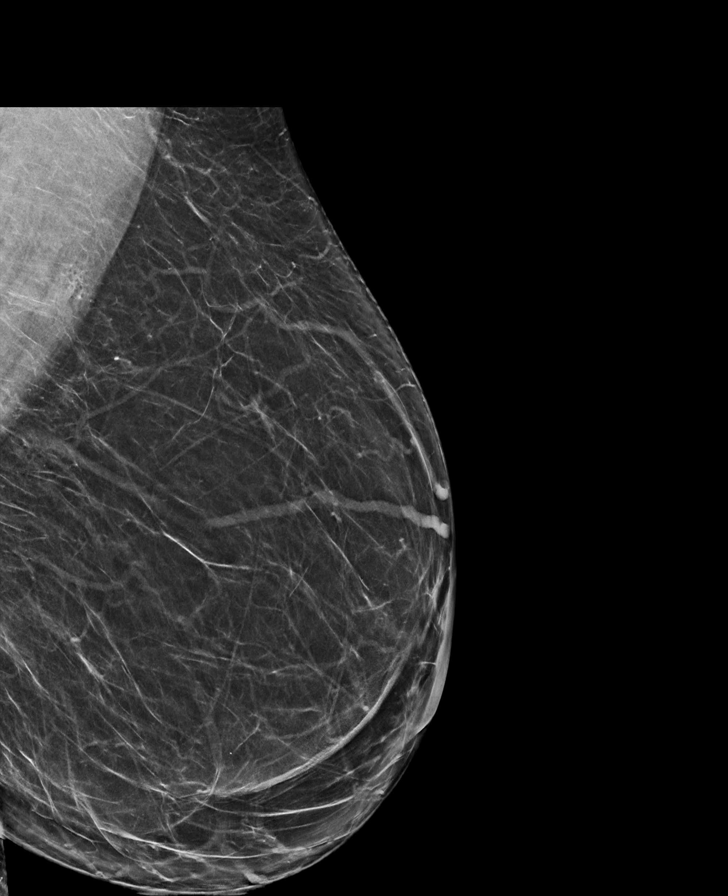

[R MLO synth-2D (2 of 2)]
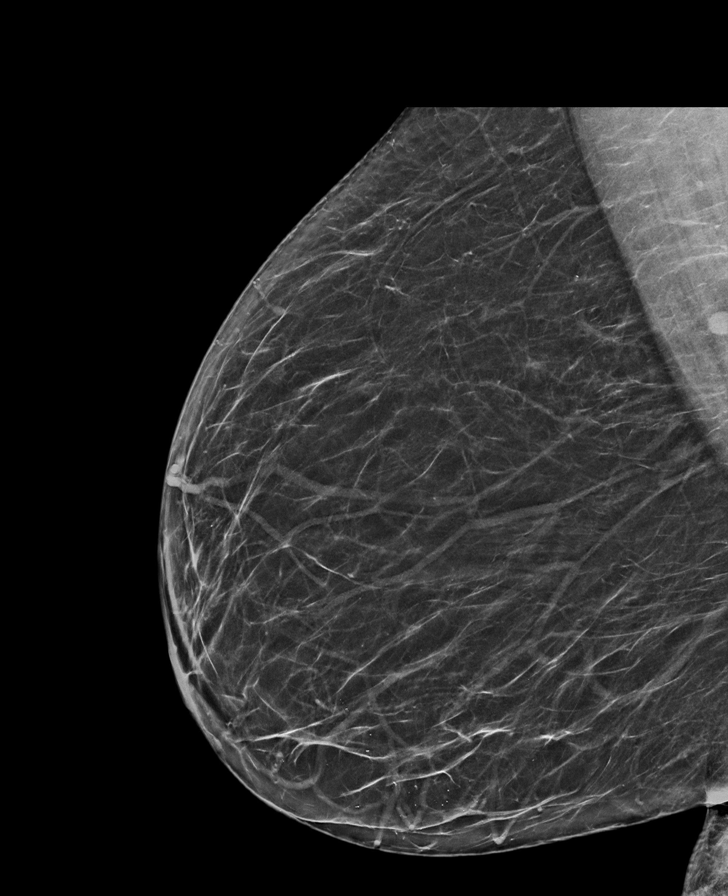

[L MLO synth-2D (2 of 2)]
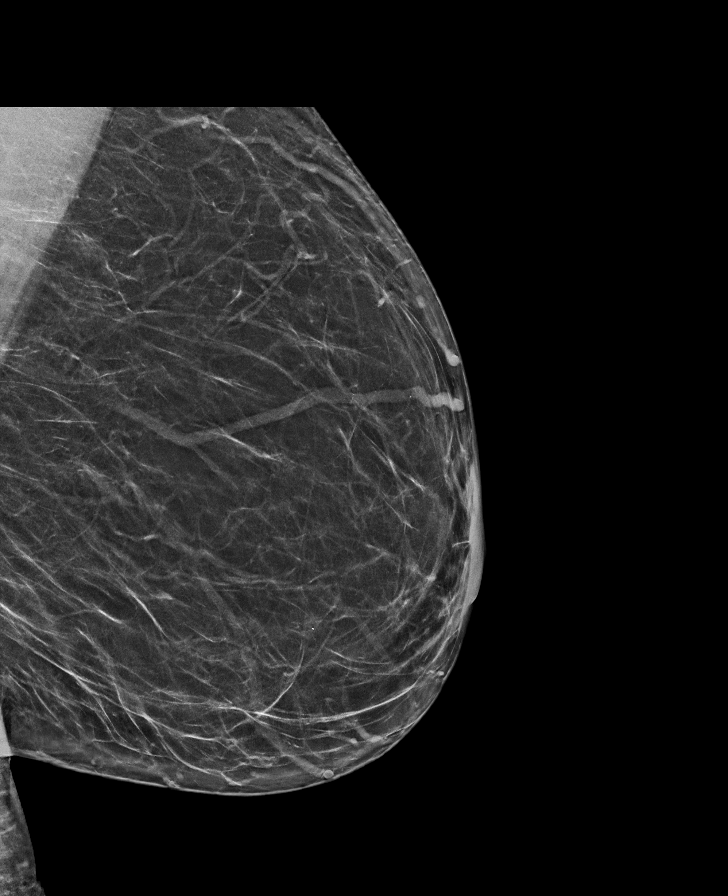

[R CV synth-2D]
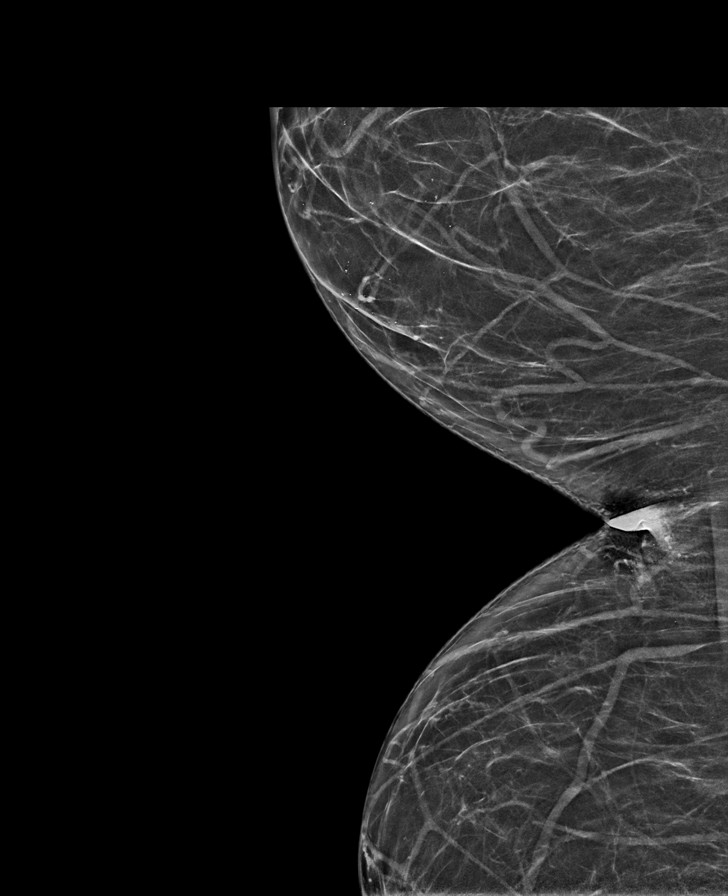

[L CC synth-2D]
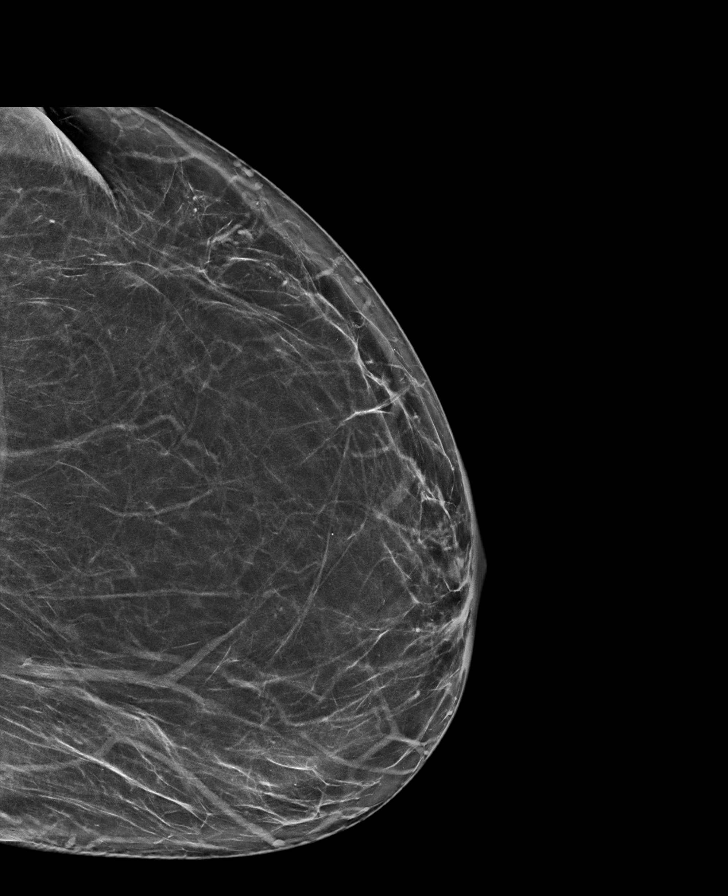

[L MLO tomo · tomo slice 37/74.0]
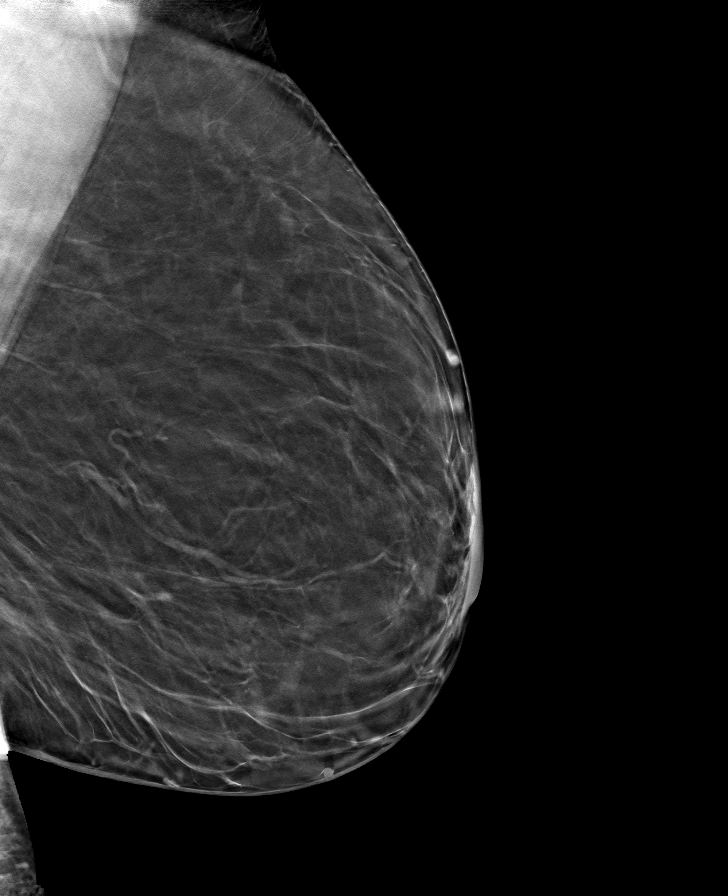

[8 of 40 positions shown; findings below may reference images not displayed]

FINDINGS: There are no findings suspicious for malignancy.
IMPRESSION: No mammographic evidence of malignancy. A result letter of this
screening mammogram will be mailed directly to the patient.

RECOMMENDATION:
Screening mammogram in one year. (Code:0E-3-N98)

BI-RADS CATEGORY  1: Negative.

## 2021-06-01 ENCOUNTER — Other Ambulatory Visit: Payer: Self-pay

## 2021-06-01 ENCOUNTER — Encounter (HOSPITAL_BASED_OUTPATIENT_CLINIC_OR_DEPARTMENT_OTHER): Payer: Self-pay

## 2021-06-01 ENCOUNTER — Emergency Department (HOSPITAL_BASED_OUTPATIENT_CLINIC_OR_DEPARTMENT_OTHER)
Admission: EM | Admit: 2021-06-01 | Discharge: 2021-06-02 | Disposition: A | Discharge status: Home / Self Care | Payer: No Typology Code available for payment source | Attending: Emergency Medicine | Admitting: Emergency Medicine

## 2021-06-01 DIAGNOSIS — R590 Localized enlarged lymph nodes: Secondary | ICD-10-CM

## 2021-06-01 DIAGNOSIS — R109 Unspecified abdominal pain: Secondary | ICD-10-CM | POA: Diagnosis present

## 2021-06-01 DIAGNOSIS — R591 Generalized enlarged lymph nodes: Secondary | ICD-10-CM | POA: Insufficient documentation

## 2021-06-01 NOTE — ED Triage Notes (Signed)
Pt c/o lump to left groin. Denies known injury.

## 2021-06-02 ENCOUNTER — Emergency Department (HOSPITAL_BASED_OUTPATIENT_CLINIC_OR_DEPARTMENT_OTHER): Payer: No Typology Code available for payment source

## 2021-06-02 LAB — CBC WITH DIFFERENTIAL/PLATELET
Abs Immature Granulocytes: 0.01 10*3/uL (ref 0.00–0.07)
Basophils Absolute: 0 10*3/uL (ref 0.0–0.1)
Basophils Relative: 0 %
Eosinophils Absolute: 0.3 10*3/uL (ref 0.0–0.5)
Eosinophils Relative: 5 %
HCT: 36.3 % (ref 36.0–46.0)
Hemoglobin: 12 g/dL (ref 12.0–15.0)
Immature Granulocytes: 0 %
Lymphocytes Relative: 45 %
Lymphs Abs: 2.3 10*3/uL (ref 0.7–4.0)
MCH: 28.7 pg (ref 26.0–34.0)
MCHC: 33.1 g/dL (ref 30.0–36.0)
MCV: 86.8 fL (ref 80.0–100.0)
Monocytes Absolute: 0.3 10*3/uL (ref 0.1–1.0)
Monocytes Relative: 7 %
Neutro Abs: 2.2 10*3/uL (ref 1.7–7.7)
Neutrophils Relative %: 43 %
Platelets: 302 10*3/uL (ref 150–400)
RBC: 4.18 MIL/uL (ref 3.87–5.11)
RDW: 13.6 % (ref 11.5–15.5)
WBC: 5.1 10*3/uL (ref 4.0–10.5)
nRBC: 0 % (ref 0.0–0.2)

## 2021-06-02 LAB — URINALYSIS, ROUTINE W REFLEX MICROSCOPIC
Glucose, UA: NEGATIVE mg/dL
Hgb urine dipstick: NEGATIVE
Ketones, ur: NEGATIVE mg/dL
Leukocytes,Ua: NEGATIVE
Nitrite: NEGATIVE
Protein, ur: NEGATIVE mg/dL
Specific Gravity, Urine: 1.03 (ref 1.005–1.030)
pH: 5.5 (ref 5.0–8.0)

## 2021-06-02 LAB — BASIC METABOLIC PANEL
Anion gap: 8 (ref 5–15)
BUN: 9 mg/dL (ref 6–20)
CO2: 25 mmol/L (ref 22–32)
Calcium: 8.5 mg/dL — ABNORMAL LOW (ref 8.9–10.3)
Chloride: 105 mmol/L (ref 98–111)
Creatinine, Ser: 0.68 mg/dL (ref 0.44–1.00)
GFR, Estimated: >60 mL/min (ref >60)
Glucose, Bld: 104 mg/dL — ABNORMAL HIGH (ref 70–99)
Potassium: 3.2 mmol/L — ABNORMAL LOW (ref 3.5–5.1)
Sodium: 138 mmol/L (ref 135–145)

## 2021-06-02 MED ORDER — IOHEXOL 300 MG/ML  SOLN
100.0000 mL | Freq: Once | INTRAMUSCULAR | Status: AC | PRN
  Administered 2021-06-02: 100 mL via INTRAVENOUS

## 2021-06-02 NOTE — ED Provider Notes (Signed)
[facility] EMERGENCY DEPARTMENT Provider Note   CSN: [id number] Arrival date & time: 06/01/21  1930     History Chief Complaint  Patient presents with   Groin Swelling    Rachael White is a 55 y.o. female.  The history is provided by the patient.  Patient with history of anxiety presents with pain and swelling in her left groin.  This has been present for the past 3 days.  She reports its been gradually getting worse.  It is worse with movement and palpation, improved with rest. No injuries.  No heavy lifting.  She is able to ambulate.  No fevers or vomiting.  No chest pain shortness of breath.  No dysuria.  No change in her bowel movements. No history of cancer.  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 Sept 2014 06/13/2012    Past Surgical History:  Procedure Laterality Date   BREAST REDUCTION SURGERY     COSMETIC SURGERY     EVALUATION UNDER ANESTHESIA WITH HEMORRHOIDECTOMY N/A 04/29/2015   Procedure: EXAM UNDER ANESTHESIA WITH INTERNAL HEMORRHOID BANDING AND EXTERNAL HEMORRHOIDECTOMY;  Surgeon: Luretha Murphy, MD;  Location: [facility];  Service: General;  Laterality: N/A;   GASTRIC BANDING PORT REVISION  09/04/2012   Procedure: GASTRIC BANDING PORT REVISION;  Surgeon: Valarie Merino, MD;  Location: [facility];  Service: General;  Laterality: N/A;  Lap Band Port Revision   KNEE ARTHROSCOPY     bilaterial   LAPAROSCOPIC GASTRIC BANDING  With vagotomy July 2007   Lapband vagotomy study patient   LAPAROSCOPIC REVISION OF GASTRIC BAND N/A 05/28/2013   Procedure: LAPAROSCOPIC REVISION OF GASTRIC BAND;  Surgeon: Valarie Merino, MD;  Location: [facility];  Service: General;  Laterality: N/A;  REMOVAL OF GASTRIC BAND AND INSERTION OF NEW AP BAND with  MESH   NASAL SINUS SURGERY     TONSILLECTOMY     WRIST GANGLION EXCISION       OB History   No obstetric history on file.     Family History  Problem Relation Age of Onset   Cancer Father        prostate   Diabetes Mother    Heart disease Mother    Diabetes Brother    Diabetes Maternal Grandmother     Social History   Tobacco Use   Smoking status: Never   Smokeless tobacco: Never  Substance Use Topics   Alcohol use: Yes    Alcohol/week: 1.0 - 3.0 standard drink    Types: 1 - 3 Glasses of wine per week    Comment: wine occ   Drug use: No    Home Medications Prior to Admission medications   Medication Sig Start Date End Date Taking? Authorizing Provider  aluminum & magnesium hydroxide-simethicone (MYLANTA) 500-450-40 MG/5ML suspension Take 15 mLs by mouth every 6 (six) hours as needed. For stomach    [provider]  amphetamine-dextroamphetamine (ADDERALL) 30 MG tablet TAKE 1 TABLET BY MOUTH IN THE MORNING AND 1 TAB AT NOON 03/27/15   [provider]  diazepam (VALIUM) 5 MG tablet Take 5 mg by mouth every 8 (eight) hours as needed for anxiety.  04/09/12   [provider]  ketotifen (ZADITOR) 0.025 %  ophthalmic solution Place 1-2 drops into both eyes 2 (two) times daily as needed (allergies.).    [provider]  naproxen sodium (ANAPROX) 220 MG tablet Take 440 mg by mouth 2 (two) times daily as needed (pain.).    [provider]  oseltamivir (TAMIFLU) 75 MG capsule Take 1 capsule (75 mg total) by mouth 2 (two) times daily. Patient not taking: Reported on 04/20/2015 12/09/14   Wallis Bamberg, PA-C  oxyCODONE (OXY IR/ROXICODONE) 5 MG immediate release tablet Take 1 tablet (5 mg total) by mouth every 4 (four) hours as needed for severe pain or breakthrough pain. Patient not taking: Reported on 02/26/2017 04/29/15   Luretha Murphy, MD  venlafaxine XR (EFFEXOR-XR) 75 MG 24 hr capsule Take 75 mg by mouth daily. 01/15/17   [provider]    Allergies    Penicillins  Review of Systems   Review of Systems  Constitutional:  Negative for diaphoresis, fatigue, fever and unexpected weight change.  Respiratory:  Negative for shortness of breath.   Cardiovascular:  Negative for chest pain.  Gastrointestinal:  Negative for diarrhea and vomiting.  Genitourinary:  Negative for dysuria.  Hematological:  Positive for adenopathy.  All other systems reviewed and are negative.  Physical Exam Updated Vital Signs BP 129/80 (BP Location: Left Arm)   Pulse 75   Temp 98.4 F (36.9 C) (Oral)   Resp 18   Ht 1.689 m (5' 6.5")   Wt 101.6 kg   SpO2 98%   BMI 35.61 kg/m   Physical Exam CONSTITUTIONAL: Well developed/well nourished HEAD: Normocephalic/atraumatic EYES: EOMI/PERRL ENMT: Mucous membranes moist NECK: supple no meningeal signs SPINE/BACK:entire spine nontender CV: S1/S2 noted, no murmurs/rubs/gallops noted LUNGS: Lungs are clear to auscultation bilaterally, no apparent distress ABDOMEN: soft, nontender, no rebound or guarding, bowel sounds noted throughout abdomen, no LLQ tenderness GU:no cva tenderness Nurse Almira Coaster present for exam Patient has mild tenderness to left inguinal region.  No erythema, no warmth, no abscess.  No thrill is noted.  Left femoral pulse is equal to right femoral pulse No hernias noted.  No significant lymphadenopathy NEURO: Pt is awake/alert/appropriate, moves all extremitiesx4.  No facial droop.   EXTREMITIES: pulses normal/equal, full ROM SKIN: warm, color normal PSYCH: no abnormalities of mood noted, alert and oriented to situation  ED Results / Procedures / Treatments   Labs (all labs ordered are listed, but only abnormal results are displayed) Labs Reviewed  BASIC METABOLIC PANEL - Abnormal; Notable for the following components:      Result Value   Potassium 3.2 (*)    Glucose, Bld 104 (*)    Calcium 8.5 (*)    All other components within normal limits  URINALYSIS, ROUTINE W  REFLEX MICROSCOPIC - Abnormal; Notable for the following components:   Color, Urine AMBER (*)    Bilirubin Urine SMALL (*)    All other components within normal limits  CBC WITH DIFFERENTIAL/PLATELET    EKG None  Radiology CT ABDOMEN PELVIS W CONTRAST  Result Date: 06/02/2021 CLINICAL DATA:  Acute abdominal pain. EXAM: CT ABDOMEN AND PELVIS WITH CONTRAST TECHNIQUE: Multidetector CT imaging of the abdomen and pelvis was performed using the standard protocol following bolus administration of intravenous contrast. CONTRAST:  OMNIPAQUE IOHEXOL 300 MG/ML  SOLN COMPARISON:  July 09, 2012 FINDINGS: Lower chest: No acute abnormality. Hepatobiliary: There is diffuse fatty infiltration of the liver parenchyma. No focal liver abnormality is seen. No gallstones, gallbladder wall thickening, or biliary dilatation. Pancreas: Unremarkable. No pancreatic  ductal dilatation or surrounding inflammatory changes. Spleen: Normal in size without focal abnormality. Adrenals/Urinary Tract: Adrenal glands are unremarkable. Kidneys are normal, without renal calculi, focal lesion, or hydronephrosis. Urinary bladder is empty and subsequently limited in evaluation. Stomach/Bowel: There is evidence of prior gastric banding surgery. Large hiatal hernia is seen. Appendix appears normal. No evidence of bowel wall thickening, distention, or inflammatory changes. Vascular/Lymphatic: No significant vascular findings are present. No enlarged abdominal or pelvic lymph nodes. Subcentimeter bilateral inguinal lymph nodes are seen. A mild amount of surrounding inflammatory fat stranding is noted on the left. Reproductive: An IUD is in place. The uterus and bilateral adnexa are otherwise unremarkable. Other: No abdominal wall hernia or abnormality. No abdominopelvic ascites. Musculoskeletal: No acute or significant osseous findings. IMPRESSION: 1. Evidence of prior gastric banding surgery with a large hiatal hernia. 2. Hepatic  steatosis. 3. Very mild left inguinal inflammation with mild reactive lymphadenopathy. Electronically Signed   By: Aram Candela M.D.   On: 06/02/2021 01:31    Procedures Procedures   Medications Ordered in ED Medications  iohexol (OMNIPAQUE) 300 MG/ML solution 100 mL (100 mLs Intravenous Contrast Given 06/02/21 0104)    ED Course  I have reviewed the triage vital signs and the nursing notes.  Pertinent labs & imaging results that were available during my care of the patient were reviewed by me and considered in my medical decision making (see chart for details).    MDM Rules/Calculators/A&P                           Patient presents with left groin/inguinal pain for the past 3 days.  No trauma.  No fevers or vomiting or night sweats.  Patient was focally tender on exam.  Exam was somewhat limited, therefore I was unable to fully rule out an emergent condition.  After further discussion, we decided to proceed with CT imaging to evaluate for any intra-abdominal emergency. I personally reviewed CT scan which reveals some lymphadenopathy but no other acute findings. Patient will be referred to her PCP to monitor this as an outpatient. Patient has no known history of malignancy and no other symptoms, but this needs to be followed as an outpatient. Patient was advised to follow-up in the next 2-3 months Final Clinical Impression(s) / ED Diagnoses Final diagnoses:  Lymphadenopathy, inguinal    Rx / DC Orders ED Discharge Orders     None        Zadie Rhine, MD 06/02/21 0200

## 2021-06-08 ENCOUNTER — Ambulatory Visit (INDEPENDENT_AMBULATORY_CARE_PROVIDER_SITE_OTHER): Payer: No Typology Code available for payment source | Admitting: Nurse Practitioner

## 2021-06-08 ENCOUNTER — Other Ambulatory Visit: Payer: Self-pay

## 2021-06-08 ENCOUNTER — Encounter: Payer: Self-pay | Admitting: Nurse Practitioner

## 2021-06-08 VITALS — BP 132/78 | HR 82 | Temp 99.2°F | Ht 66.4 in | Wt 228.0 lb

## 2021-06-08 DIAGNOSIS — Z2821 Immunization not carried out because of patient refusal: Secondary | ICD-10-CM

## 2021-06-08 DIAGNOSIS — E876 Hypokalemia: Secondary | ICD-10-CM | POA: Diagnosis not present

## 2021-06-08 DIAGNOSIS — R590 Localized enlarged lymph nodes: Secondary | ICD-10-CM

## 2021-06-08 DIAGNOSIS — Z6836 Body mass index (BMI) 36.0-36.9, adult: Secondary | ICD-10-CM

## 2021-06-08 DIAGNOSIS — E6609 Other obesity due to excess calories: Secondary | ICD-10-CM

## 2021-06-08 DIAGNOSIS — Z0000 Encounter for general adult medical examination without abnormal findings: Secondary | ICD-10-CM

## 2021-06-08 NOTE — Progress Notes (Signed)
[employer] as a Education administrator for [employer], NP.,have documented all relevant documentation on the behalf of [employer], NP,as directed by  Bary Castilla, NP while in the presence of Bary Castilla, NP.   This visit occurred during the SARS-CoV-2 public health emergency.  Safety protocols were in place, including screening questions prior to the visit, additional usage of staff PPE, and extensive cleaning of exam room while observing appropriate contact time as indicated for disinfecting solutions.  Subjective:     Patient ID: Rachael White , female    DOB: 02-22-1966 , 55 y.o.   MRN: [id number]   Chief Complaint  Patient presents with   Establish Care     HPI  Patient is here to reestablish care. She was last seen in 2019. She was diagnosed with lymphadenopathy at the ED and would like to be referred to a specialist.  Diet: She is trying to do lifestyle changes, cut down sugar and exercising more  She drinks occasionally.  LMP: she is unsure     Past Medical History:  Diagnosis Date   Allergy    Anxiety      Family History  Problem Relation Age of Onset   Cancer Father        prostate   Diabetes Mother    Heart disease Mother    Diabetes Brother    Diabetes Maternal Grandmother      Current Outpatient Medications:    aluminum & magnesium hydroxide-simethicone (MYLANTA) 500-450-40 MG/5ML suspension, Take 15 mLs by mouth every 6 (six) hours as needed. For stomach, Disp: , Rfl:    amphetamine-dextroamphetamine (ADDERALL) 30 MG tablet, TAKE 1 TABLET BY MOUTH IN THE MORNING AND 1 TAB AT NOON, Disp: , Rfl: 0   diazepam (VALIUM) 5 MG tablet, Take 5 mg by mouth every 8 (eight) hours as needed for anxiety. , Disp: , Rfl:    ketotifen (ZADITOR) 0.025 % ophthalmic solution, Place 1-2 drops into both eyes 2 (two) times daily as needed (allergies.)., Disp: , Rfl:    venlafaxine XR (EFFEXOR-XR) 75 MG 24 hr capsule, Take 75 mg by mouth daily., Disp: ,  Rfl: 12   Allergies  Allergen Reactions   Penicillins Rash    Unknown childhood allergy.  Has patient had a PCN reaction causing immediate rash, facial/tongue/throat swelling, SOB or lightheadedness with hypotension: No Has patient had a PCN reaction causing severe rash involving mucus membranes or skin necrosis: No Has patient had a PCN reaction that required hospitalization: No Has patient had a PCN reaction occurring within the last 10 years: No If all of the above answers are "NO", then may proceed with Cephalosporin use.     Review of Systems  Constitutional: Negative.  Negative for chills, fatigue and fever.  HENT:  Negative for congestion.   Respiratory:  Negative for cough, choking, shortness of breath and wheezing.   Cardiovascular: Negative.  Negative for chest pain and palpitations.  Gastrointestinal: Negative.   Genitourinary:  Negative for vaginal pain.  Musculoskeletal:  Negative for arthralgias and myalgias.  Neurological: Negative.  Negative for dizziness.  Hematological:  Positive for adenopathy.    Today's Vitals   06/08/21 1516  BP: 132/78  Pulse: 82  Temp: 99.2 F (37.3 C)  TempSrc: Oral  Weight: 228 lb (103.4 kg)  Height: 5' 6.4" (1.687 m)   Body mass index is 36.36 kg/m.  Wt Readings from Last 3 Encounters:  06/08/21 228 lb (103.4 kg)  06/01/21 224 lb (101.6 kg)  08/23/20  230 lb (104.3 kg)    Objective:  Physical Exam Constitutional:      Appearance: Normal appearance. She is obese.  HENT:     Head: Normocephalic and atraumatic.  Cardiovascular:     Rate and Rhythm: Normal rate and regular rhythm.     Pulses: Normal pulses.     Heart sounds: Normal heart sounds. No murmur heard. Pulmonary:     Effort: Pulmonary effort is normal. No respiratory distress.     Breath sounds: Normal breath sounds. No wheezing.  Lymphadenopathy:     Comments: Tenderness to the left side of groin/abdominal area.   Skin:    General: Skin is warm and dry.      Capillary Refill: Capillary refill takes less than 2 seconds.  Neurological:     Mental Status: She is alert and oriented to person, place, and time.        Assessment And Plan:     1. Encounter for medical examination to establish care --Patient is here to establish care. Martin Majestic over patient medical, family, social and surgical history. -Reviewed with patient their medications and any allergies  -Reviewed with patient their sexual orientation, drug/tobacco and alcohol use -Dicussed any new concerns with patient  -recommended patient comes in for a physical exam and complete blood work.  -Educated patient about the importance of annual screenings and immunizations.  -Advised patient to eat a healthy diet along with exercise for atleast 30-45 min atleast 4-5 days of the week.   2. Lymphadenopathy, inguinal - Ambulatory referral to Hematology / Oncology  3. Hypokalemia - CMP14+EGFR -Will check and supplement if needed -Pt. Has been taking OTC potassium pills and supplementing with potassium rich foods.   4. Hypocalcemia - CMP14+EGFR - PTH, Intact and Calcium - Magnesium - Vitamin D (25 hydroxy)  5. Immunization declined  6. Class 2 obesity due to excess calories without serious comorbidity with body mass index (BMI) of 36.0 to 36.9 in adult  -Advised patient on a healthy diet including avoiding fast food and red meats. Increase the intake of lean meats including grilled chicken and [place].  Drink a lot of water. Decrease intake of fatty foods. Exercise for 30-45 min. 4-5 a week to decrease the risk of cardiac event.    The patient was encouraged to call or send a message through [place] for any questions or concerns.   Follow up: if symptoms persist or do not get better.   Side effects and appropriate use of all the medication(s) were discussed with the patient today. Patient advised to use the medication(s) as directed by their healthcare provider. The patient was encouraged  to read, review, and understand all associated package inserts and contact our office with any questions or concerns. The patient accepts the risks of the treatment plan and had an opportunity to ask questions.   Patient was given opportunity to ask questions. Patient verbalized understanding of the plan and was able to repeat key elements of the plan. All questions were answered to their satisfaction.  Raman Thao Vanover, DNP   I, Raman Siddhi Dornbush have reviewed all documentation for this visit. The documentation on 06/08/21 for the exam, diagnosis, procedures, and orders are all accurate and complete.   IF YOU HAVE BEEN REFERRED TO A SPECIALIST, IT MAY TAKE 1-2 WEEKS TO SCHEDULE/PROCESS THE REFERRAL. IF YOU HAVE NOT HEARD FROM US/SPECIALIST IN TWO WEEKS, PLEASE GIVE [place] A CALL AT [phone] X [phone].   THE PATIENT IS ENCOURAGED TO PRACTICE SOCIAL DISTANCING DUE  TO THE COVID-19 PANDEMIC.

## 2021-06-08 NOTE — Patient Instructions (Signed)
Lymphadenopathy °Lymphadenopathy means that your lymph glands are swollen or larger than normal. Lymph glands, also called lymph nodes, are collections of tissue that filter excess fluid, bacteria, viruses, and waste from your bloodstream. They are part of your body's disease-fighting system (immune system), which protects your body from germs. °There may be different causes of lymphadenopathy, depending on where it is in your body. Some types go away on their own. Lymphadenopathy can occur anywhere that you have lymph glands, including these areas: °Neck (cervical lymphadenopathy). °Chest (mediastinal lymphadenopathy). °Lungs (hilar lymphadenopathy). °Underarms (axillary lymphadenopathy). °Groin (inguinal lymphadenopathy). °When your immune system responds to germs, infection-fighting cells and fluid build up in your lymph glands. This causes some swelling and enlargement. If the lymph nodes do not go back to normal size after you have an infection or disease, your health care provider may do tests. These tests help to monitor your condition and find the reason why the glands are still swollen and enlarged. °Follow these instructions at home: ° °Get plenty of rest. °Your health care provider may recommend over-the-counter medicines for pain. Take over-the-counter and prescription medicines only as told by your health care provider. °If directed, apply heat to swollen lymph glands as often as told by your health care provider. Use the heat source that your health care provider recommends, such as a moist heat pack or a heating pad. °Place a towel between your skin and the heat source. °Leave the heat on for 20-30 minutes. °Remove the heat if your skin turns bright red. This is especially important if you are unable to feel pain, heat, or cold. You may have a greater risk of getting burned. °Check your affected lymph glands every day for changes. Check other lymph gland areas as told by your health care provider.  Check for changes such as: °More swelling. °Sudden increase in size. °Redness or pain. °Hardness. °Keep all follow-up visits. This is important. °Contact a health care provider if you have: °Lymph glands that: °Are still swollen after 2 weeks. °Have suddenly gotten bigger or the swelling spreads. °Are red, painful, or hard. °Fluid leaking from the skin near an enlarged lymph gland. °Problems with breathing. °A fever, chills, or night sweats. °Fatigue. °A sore throat. °Pain in your abdomen. °Weight loss. °Get help right away if you have: °Severe pain. °Chest pain. °Shortness of breath. °These symptoms may represent a serious problem that is an emergency. Do not wait to see if the symptoms will go away. Get medical help right away. Call your local emergency services (911 in the [place] Do not drive yourself to the hospital. °Summary °Lymphadenopathy means that your lymph glands are swollen or larger than normal. °Lymph glands, also called lymph nodes, are collections of tissue that filter excess fluid, bacteria, viruses, and waste from the bloodstream. They are part of your body's disease-fighting system (immune system). °Lymphadenopathy can occur anywhere that you have lymph glands. °If the lymph nodes do not go back to normal size after you have an infection or disease, your health care provider may do tests to monitor your condition and find the reason why the glands are still swollen and enlarged. °Check your affected lymph glands every day for changes. Check other lymph gland areas as told by your health care provider. °This information is not intended to replace advice given to you by your health care provider. Make sure you discuss any questions you have with your health care provider. °Document Revised: 06/10/2020 Document Reviewed: 06/10/2020 °Elsevier Patient Education © 2022   [employer] ° °

## 2021-06-09 LAB — CMP14+EGFR
ALT: 14 IU/L (ref 0–32)
AST: 15 IU/L (ref 0–40)
Albumin/Globulin Ratio: 1.3 (ref 1.2–2.2)
Albumin: 3.9 g/dL (ref 3.8–4.9)
Alkaline Phosphatase: 83 IU/L (ref 44–121)
BUN/Creatinine Ratio: 12 (ref 9–23)
BUN: 8 mg/dL (ref 6–24)
Bilirubin Total: 0.3 mg/dL (ref 0.0–1.2)
CO2: 27 mmol/L (ref 20–29)
Calcium: 9.2 mg/dL (ref 8.7–10.2)
Chloride: 104 mmol/L (ref 96–106)
Creatinine, Ser: 0.67 mg/dL (ref 0.57–1.00)
Globulin, Total: 3.1 g/dL (ref 1.5–4.5)
Glucose: 75 mg/dL (ref 70–99)
Potassium: 4.4 mmol/L (ref 3.5–5.2)
Sodium: 143 mmol/L (ref 134–144)
Total Protein: 7 g/dL (ref 6.0–8.5)
eGFR: 104 mL/min/{1.73_m2} (ref >59)

## 2021-06-09 LAB — PTH, INTACT AND CALCIUM: PTH: 72 pg/mL — ABNORMAL HIGH (ref 15–65)

## 2021-06-09 LAB — MAGNESIUM: Magnesium: 1.9 mg/dL (ref 1.6–2.3)

## 2021-06-09 LAB — VITAMIN D 25 HYDROXY (VIT D DEFICIENCY, FRACTURES): Vit D, 25-Hydroxy: 17.3 ng/mL — ABNORMAL LOW (ref 30.0–100.0)

## 2021-06-10 ENCOUNTER — Telehealth: Payer: Self-pay | Admitting: Hematology and Oncology

## 2021-06-10 NOTE — Telephone Encounter (Signed)
Scheduled appt per 10/11 referral. Pt is aware of appt date and time.  

## 2021-06-11 ENCOUNTER — Inpatient Hospital Stay: Payer: No Typology Code available for payment source

## 2021-06-11 ENCOUNTER — Inpatient Hospital Stay
Payer: No Typology Code available for payment source | Attending: Hematology and Oncology | Admitting: Hematology and Oncology

## 2021-06-11 ENCOUNTER — Other Ambulatory Visit: Payer: Self-pay

## 2021-06-11 VITALS — BP 148/83 | HR 89 | Temp 97.1°F | Resp 20 | Ht 66.4 in | Wt 232.5 lb

## 2021-06-11 DIAGNOSIS — Z79899 Other long term (current) drug therapy: Secondary | ICD-10-CM | POA: Diagnosis not present

## 2021-06-11 DIAGNOSIS — F419 Anxiety disorder, unspecified: Secondary | ICD-10-CM | POA: Diagnosis not present

## 2021-06-11 DIAGNOSIS — R590 Localized enlarged lymph nodes: Secondary | ICD-10-CM | POA: Diagnosis present

## 2021-06-11 LAB — URINALYSIS, COMPLETE (UACMP) WITH MICROSCOPIC
Bacteria, UA: NONE SEEN
Bilirubin Urine: NEGATIVE
Glucose, UA: NEGATIVE mg/dL
Ketones, ur: NEGATIVE mg/dL
Leukocytes,Ua: NEGATIVE
Nitrite: NEGATIVE
Protein, ur: NEGATIVE mg/dL
Specific Gravity, Urine: 1.024 (ref 1.005–1.030)
pH: 5 (ref 5.0–8.0)

## 2021-06-11 LAB — CMP (CANCER CENTER ONLY)
ALT: 16 U/L (ref 0–44)
AST: 20 U/L (ref 15–41)
Albumin: 3.6 g/dL (ref 3.5–5.0)
Alkaline Phosphatase: 68 U/L (ref 38–126)
Anion gap: 8 (ref 5–15)
BUN: 8 mg/dL (ref 6–20)
CO2: 28 mmol/L (ref 22–32)
Calcium: 9.3 mg/dL (ref 8.9–10.3)
Chloride: 103 mmol/L (ref 98–111)
Creatinine: 0.68 mg/dL (ref 0.44–1.00)
GFR, Estimated: >60 mL/min (ref >60)
Glucose, Bld: 106 mg/dL — ABNORMAL HIGH (ref 70–99)
Potassium: 3.5 mmol/L (ref 3.5–5.1)
Sodium: 139 mmol/L (ref 135–145)
Total Bilirubin: 0.3 mg/dL (ref 0.3–1.2)
Total Protein: 7.7 g/dL (ref 6.5–8.1)

## 2021-06-11 LAB — C-REACTIVE PROTEIN: CRP: 0.7 mg/dL (ref <1.0)

## 2021-06-11 LAB — CBC WITH DIFFERENTIAL (CANCER CENTER ONLY)
Abs Immature Granulocytes: 0.01 10*3/uL (ref 0.00–0.07)
Basophils Absolute: 0 10*3/uL (ref 0.0–0.1)
Basophils Relative: 0 %
Eosinophils Absolute: 0.3 10*3/uL (ref 0.0–0.5)
Eosinophils Relative: 5 %
HCT: 37 % (ref 36.0–46.0)
Hemoglobin: 12 g/dL (ref 12.0–15.0)
Immature Granulocytes: 0 %
Lymphocytes Relative: 39 %
Lymphs Abs: 1.8 10*3/uL (ref 0.7–4.0)
MCH: 28.6 pg (ref 26.0–34.0)
MCHC: 32.4 g/dL (ref 30.0–36.0)
MCV: 88.1 fL (ref 80.0–100.0)
Monocytes Absolute: 0.5 10*3/uL (ref 0.1–1.0)
Monocytes Relative: 10 %
Neutro Abs: 2.1 10*3/uL (ref 1.7–7.7)
Neutrophils Relative %: 46 %
Platelet Count: 292 10*3/uL (ref 150–400)
RBC: 4.2 MIL/uL (ref 3.87–5.11)
RDW: 13.5 % (ref 11.5–15.5)
WBC Count: 4.7 10*3/uL (ref 4.0–10.5)
nRBC: 0 % (ref 0.0–0.2)

## 2021-06-11 LAB — HIV ANTIBODY (ROUTINE TESTING W REFLEX): HIV Screen 4th Generation wRfx: NONREACTIVE

## 2021-06-11 LAB — SEDIMENTATION RATE: Sed Rate: 26 mm/hr — ABNORMAL HIGH (ref 0–22)

## 2021-06-11 LAB — LACTATE DEHYDROGENASE: LDH: 131 U/L (ref 98–192)

## 2021-06-11 NOTE — Progress Notes (Signed)
[facility] [phone]   [phone]  INITIAL CONSULT NOTE  Patient Care Team: Charlesetta Ivory, NP as PCP - General (Nurse Practitioner)  Hematological/Oncological History # Pain Inguinal Lymph Nodes  06/02/2021: CT abdomen pelvis performed for abdominal pain. Found to have mild left inguinal inflammation with mild reactive lymphadenopathy. Noted to be sub-centimeter.  06/11/2021: establish care with Dr. Leonides Schanz   CHIEF COMPLAINTS/PURPOSE OF CONSULTATION:  " Pain Inguinal Lymph Nodes  "  HISTORY OF PRESENTING ILLNESS:  Rachael White 55 y.o. female with medical history significant for anxiety who presents for evaluation of painful inguinal lymphadenopathy.   On review of the previous records Rachael White underwent a CT abdomen pelvis on 06/02/2021 due to abdominal pain.  She was found to have mild left inguinal inflammation with mild reactive lymphadenopathy.  Lymph nodes were noted to be subcentimeter.  Patient requested evaluation by hematology and therefore was referred to our service for further evaluation management.  On exam today Rachael White approximately 2 weeks ago she rolled onto her stomach and thought she was lying on her butt.  She was disturbed to find that this was a firm lymph node in the left side of her groin.  She notes that it was hard and tender to the touch.  She went to the emergency department the next night for CT scan.  She reports that since then she has developed "swelling" on the left side of her hip.  She notes that it wraps along her groin and around her backside.  She notes there has been no erythema or discoloration of the overlying skin.  She is not having any issues with fevers, chills, sweats, nausea, vomiting or diarrhea.  She also notes that she does not have any change in her urinary habits such as burning or change in urine color.  On further discussion she notes that she is having issues with fatigue as well as  anxiety.  She has that she is not having any cough or shortness of breath.  She does report having issues with brain fog and concentration issues.  Her weight has been increasing despite having undergone a lap band surgery in 2006.  She is a never smoker but currently drinks alcohol about twice per month.  She currently works for the [employer].  Her family history is markable for a "blood cancer" in a maternal uncle.  She has a cousin with lupus and her mother had congestive heart failure.  Her father had Lewy body dementia.  She has 3 healthy children with the oldest son having autism.  A full 10 point ROS is listed below.  MEDICAL HISTORY:  Past Medical History:  Diagnosis Date   Allergy    Anxiety     SURGICAL HISTORY: Past Surgical History:  Procedure Laterality Date   BREAST REDUCTION SURGERY     COSMETIC SURGERY     EVALUATION UNDER ANESTHESIA WITH HEMORRHOIDECTOMY N/A 04/29/2015   Procedure: EXAM UNDER ANESTHESIA WITH INTERNAL HEMORRHOID BANDING AND EXTERNAL HEMORRHOIDECTOMY;  Surgeon: Luretha Murphy, MD;  Location: [facility];  Service: General;  Laterality: N/A;   GASTRIC BANDING PORT REVISION  09/04/2012   Procedure: GASTRIC BANDING PORT REVISION;  Surgeon: Valarie Merino, MD;  Location: [facility];  Service: General;  Laterality: N/A;  Lap Band Port Revision   KNEE ARTHROSCOPY     bilaterial   LAPAROSCOPIC GASTRIC BANDING  With vagotomy July 2007   Lapband vagotomy study patient   LAPAROSCOPIC REVISION OF GASTRIC  BAND N/A 05/28/2013   Procedure: LAPAROSCOPIC REVISION OF GASTRIC BAND;  Surgeon: Valarie Merino, MD;  Location: [facility];  Service: General;  Laterality: N/A;  REMOVAL OF GASTRIC BAND AND INSERTION OF NEW AP BAND with MESH   NASAL SINUS SURGERY     TONSILLECTOMY     WRIST GANGLION EXCISION      SOCIAL HISTORY: Social History   Socioeconomic History   Marital status: Married    Spouse name: Not on file   Number of children: Not on file   Years of education: Not on  file   Highest education level: Not on file  Occupational History   Not on file  Tobacco Use   Smoking status: Never   Smokeless tobacco: Never  Vaping Use   Vaping Use: Never used  Substance and Sexual Activity   Alcohol use: Yes    Alcohol/week: 1.0 - 3.0 standard drink    Types: 1 - 3 Glasses of wine per week    Comment: wine occ   Drug use: No   Sexual activity: Yes  Other Topics Concern   Not on file  Social History Narrative   Not on file   Social Determinants of Health   Financial Resource Strain: Not on file  Food Insecurity: Not on file  Transportation Needs: Not on file  Physical Activity: Not on file  Stress: Not on file  Social Connections: Not on file  Intimate Partner Violence: Not on file    FAMILY HISTORY: Family History  Problem Relation Age of Onset   Cancer Father        prostate   Diabetes Mother    Heart disease Mother    Diabetes Brother    Diabetes Maternal Grandmother     ALLERGIES:  is allergic to penicillins.  MEDICATIONS:  Current Outpatient Medications  Medication Sig Dispense Refill   aluminum & magnesium hydroxide-simethicone (MYLANTA) 500-450-40 MG/5ML suspension Take 15 mLs by mouth every 6 (six) hours as needed. For stomach     amphetamine-dextroamphetamine (ADDERALL) 30 MG tablet TAKE 1 TABLET BY MOUTH IN THE MORNING AND 1 TAB AT NOON  0   diazepam (VALIUM) 5 MG tablet Take 5 mg by mouth every 8 (eight) hours as needed for anxiety.      ibuprofen (ADVIL) 600 MG tablet Take 600 mg by mouth.     ketotifen (ZADITOR) 0.025 % ophthalmic solution Place 1-2 drops into both eyes 2 (two) times daily as needed (allergies.).     venlafaxine XR (EFFEXOR-XR) 75 MG 24 hr capsule Take 75 mg by mouth daily.  12   No current facility-administered medications for this visit.    REVIEW OF SYSTEMS:   Constitutional: ( - ) fevers, ( - )  chills , ( - ) night sweats Eyes: ( - ) blurriness of vision, ( - ) double vision, ( - ) watery eyes Ears,  nose, mouth, throat, and face: ( - ) mucositis, ( - ) sore throat Respiratory: ( - ) cough, ( - ) dyspnea, ( - ) wheezes Cardiovascular: ( - ) palpitation, ( - ) chest discomfort, ( - ) lower extremity swelling Gastrointestinal:  ( - ) nausea, ( - ) heartburn, ( - ) change in bowel habits Skin: ( - ) abnormal skin rashes Lymphatics: ( - ) new lymphadenopathy, ( - ) easy bruising Neurological: ( - ) numbness, ( - ) tingling, ( - ) new weaknesses Behavioral/Psych: ( - ) mood change, ( - ) new changes  All other systems were reviewed with the patient and are negative.  PHYSICAL EXAMINATION: ECOG PERFORMANCE STATUS: 1 - Symptomatic but completely ambulatory  Vitals:   06/11/21 0859  BP: (!) 148/83  Pulse: 89  Resp: 20  Temp: (!) 97.1 F (36.2 C)  SpO2: 100%   Filed Weights   06/11/21 0859  Weight: 232 lb 8 oz (105.5 kg)    GENERAL: well appearing middle-aged African-American female in NAD  SKIN: skin color, texture, turgor are normal, no rashes or significant lesions EYES: conjunctiva are pink and non-injected, sclera clear LYMPH: Firm lymph nodes in the right sided groin, less than 1 cm in diameter.  Otherwise no palpable lymphadenopathy in the cervical, axillary or supraclavicular lymph nodes.  LUNGS: clear to auscultation and percussion with normal breathing effort HEART: regular rate & rhythm and no murmurs and no lower extremity edema ABDOMEN: soft, non-tender, non-distended, normal bowel sounds Musculoskeletal: no cyanosis of digits and no clubbing  PSYCH: alert & oriented x 3, fluent speech NEURO: no focal motor/sensory deficits  LABORATORY DATA:  I have reviewed the data as listed CBC Latest Ref Rng & Units 06/11/2021 06/02/2021 02/26/2017  WBC 4.0 - 10.5 K/uL 4.7 5.1 4.9  Hemoglobin 12.0 - 15.0 g/dL 67.2 09.4 10.0(L)  Hematocrit 36.0 - 46.0 % 37.0 36.3 32.1(L)  Platelets 150 - 400 K/uL 292 302 332    CMP Latest Ref Rng & Units 06/11/2021 06/08/2021 06/02/2021  Glucose  70 - 99 mg/dL 709(G) 75 283(M)  BUN 6 - 20 mg/dL 8 8 9   Creatinine 0.44 - 1.00 mg/dL 6.29 4.76  Sodium 135 - 145 mmol/L 139 143 138  Potassium 3.5 - 5.1 mmol/L 3.5 4.4 3.2(L)  Chloride 98 - 111 mmol/L 103 104 105  CO2 22 - 32 mmol/L 28 27 25   Calcium 8.9 - 10.3 mg/dL 9.3 9.2 5.46)  Total Protein 6.5 - 8.1 g/dL 7.7 7.0 -  Total Bilirubin 0.3 - 1.2 mg/dL 0.3 0.3 -  Alkaline Phos 38 - 126 U/L 68 83 -  AST 15 - 41 U/L 20 15 -  ALT 0 - 44 U/L 16 14 -   RADIOGRAPHIC STUDIES: I have personally reviewed the radiological images as listed and agreed with the findings in the report. CT ABDOMEN PELVIS W CONTRAST  Result Date: 06/02/2021 CLINICAL DATA:  Acute abdominal pain. EXAM: CT ABDOMEN AND PELVIS WITH CONTRAST TECHNIQUE: Multidetector CT imaging of the abdomen and pelvis was performed using the standard protocol following bolus administration of intravenous contrast. CONTRAST:  5.0(P OMNIPAQUE IOHEXOL 300 MG/ML  SOLN COMPARISON:  July 09, 2012 FINDINGS: Lower chest: No acute abnormality. Hepatobiliary: There is diffuse fatty infiltration of the liver parenchyma. No focal liver abnormality is seen. No gallstones, gallbladder wall thickening, or biliary dilatation. Pancreas: Unremarkable. No pancreatic ductal dilatation or surrounding inflammatory changes. Spleen: Normal in size without focal abnormality. Adrenals/Urinary Tract: Adrenal glands are unremarkable. Kidneys are normal, without renal calculi, focal lesion, or hydronephrosis. Urinary bladder is empty and subsequently limited in evaluation. Stomach/Bowel: There is evidence of prior gastric banding surgery. Large hiatal hernia is seen. Appendix appears normal. No evidence of bowel wall thickening, distention, or inflammatory changes. Vascular/Lymphatic: No significant vascular findings are present. No enlarged abdominal or pelvic lymph nodes. Subcentimeter bilateral inguinal lymph nodes are seen. A mild amount of surrounding inflammatory  fat stranding is noted on the left. Reproductive: An IUD is in place. The uterus and bilateral adnexa are otherwise unremarkable. Other: No abdominal wall hernia or abnormality.  No abdominopelvic ascites. Musculoskeletal: No acute or significant osseous findings. IMPRESSION: 1. Evidence of prior gastric banding surgery with a large hiatal hernia. 2. Hepatic steatosis. 3. Very mild left inguinal inflammation with mild reactive lymphadenopathy. Electronically Signed   By: Aram Candela M.D.   On: 06/02/2021 01:31    ASSESSMENT & PLAN Delberta Folts Buechner 56 y.o. female with medical history significant for anxiety who presents for evaluation of painful inguinal lymphadenopathy.   After review of the labs, review of the records, and discussion with the patient the patients findings are most consistent with benign reactive lymphadenopathy in the setting of inflammation.  Today we will order inflammatory markers as well as LDH, CBC and CMP.  The lymph nodes are subcentimeter and there are no red flags for any more concerning underlying process.  I recommend she return to clinic in approximately 6 months time or sooner if she were to develop new or worsening symptoms.  # Pain Inguinal Lymph Nodes  --Findings are most consistent with a benign lymphadenopathy.  The lymph nodes are subcentimeter --We will order LDH and inflammatory markers --We will repeat CBC and CMP --Recommend return to clinic urgently if she were to develop new symptoms or worsening lymphadenopathy --Return to clinic in 6 months time.  Orders Placed This Encounter  Procedures   CBC with Differential (Cancer Center Only)    Standing Status:   Future    Number of Occurrences:   1    Standing Expiration Date:   06/11/2022   CMP (Cancer Center only)    Standing Status:   Future    Number of Occurrences:   1    Standing Expiration Date:   06/11/2022   Urinalysis, Complete w Microscopic    Standing Status:   Future    Number of  Occurrences:   1    Standing Expiration Date:   06/11/2022   Lactate dehydrogenase (LDH)    Standing Status:   Future    Number of Occurrences:   1    Standing Expiration Date:   06/11/2022   Sedimentation rate    Standing Status:   Future    Number of Occurrences:   1    Standing Expiration Date:   06/11/2022   C-reactive protein    Standing Status:   Future    Number of Occurrences:   1    Standing Expiration Date:   06/11/2022   GC probe amplification, urine   RPR    Standing Status:   Future    Standing Expiration Date:   06/11/2022   HIV antibody (with reflex)    All questions were answered. The patient knows to call the clinic with any problems, questions or concerns.  A total of more than 60 minutes were spent on this encounter with face-to-face time and non-face-to-face time, including preparing to see the patient, ordering tests and/or medications, counseling the patient and coordination of care as outlined above.   Ulysees Barns, MD Department of Hematology/Oncology [facility] Phone: [phone] Pager: [phone] Email: [email or web address]  06/15/2021 12:08 PM

## 2021-06-16 ENCOUNTER — Other Ambulatory Visit: Payer: Self-pay | Admitting: Nurse Practitioner

## 2021-06-16 DIAGNOSIS — E559 Vitamin D deficiency, unspecified: Secondary | ICD-10-CM

## 2021-06-16 DIAGNOSIS — Z0000 Encounter for general adult medical examination without abnormal findings: Secondary | ICD-10-CM

## 2021-06-16 MED ORDER — VITAMIN D (ERGOCALCIFEROL) 1.25 MG (50000 UNIT) PO CAPS: 50000.0000 [IU] | ORAL_CAPSULE | ORAL | 0 refills | Status: DC

## 2021-09-09 ENCOUNTER — Other Ambulatory Visit: Payer: Self-pay

## 2021-09-09 ENCOUNTER — Ambulatory Visit (INDEPENDENT_AMBULATORY_CARE_PROVIDER_SITE_OTHER): Payer: No Typology Code available for payment source | Admitting: Nurse Practitioner

## 2021-09-09 ENCOUNTER — Other Ambulatory Visit (HOSPITAL_COMMUNITY)
Admission: RE | Admit: 2021-09-09 | Discharge: 2021-09-09 | Disposition: A | Discharge status: Home / Self Care | Payer: No Typology Code available for payment source | Source: Ambulatory Visit | Attending: Nurse Practitioner | Admitting: Nurse Practitioner

## 2021-09-09 VITALS — BP 118/64 | HR 96 | Temp 98.8°F | Ht 66.4 in | Wt 228.0 lb

## 2021-09-09 DIAGNOSIS — E559 Vitamin D deficiency, unspecified: Secondary | ICD-10-CM

## 2021-09-09 DIAGNOSIS — E6609 Other obesity due to excess calories: Secondary | ICD-10-CM

## 2021-09-09 DIAGNOSIS — Z0000 Encounter for general adult medical examination without abnormal findings: Secondary | ICD-10-CM

## 2021-09-09 DIAGNOSIS — R590 Localized enlarged lymph nodes: Secondary | ICD-10-CM

## 2021-09-09 DIAGNOSIS — Z23 Encounter for immunization: Secondary | ICD-10-CM

## 2021-09-09 DIAGNOSIS — N898 Other specified noninflammatory disorders of vagina: Secondary | ICD-10-CM | POA: Insufficient documentation

## 2021-09-09 DIAGNOSIS — Z1211 Encounter for screening for malignant neoplasm of colon: Secondary | ICD-10-CM

## 2021-09-09 DIAGNOSIS — R232 Flushing: Secondary | ICD-10-CM

## 2021-09-09 DIAGNOSIS — Z6836 Body mass index (BMI) 36.0-36.9, adult: Secondary | ICD-10-CM

## 2021-09-09 DIAGNOSIS — Z13228 Encounter for screening for other metabolic disorders: Secondary | ICD-10-CM

## 2021-09-09 DIAGNOSIS — F909 Attention-deficit hyperactivity disorder, unspecified type: Secondary | ICD-10-CM

## 2021-09-09 DIAGNOSIS — Z1159 Encounter for screening for other viral diseases: Secondary | ICD-10-CM

## 2021-09-09 DIAGNOSIS — Z0001 Encounter for general adult medical examination with abnormal findings: Secondary | ICD-10-CM | POA: Diagnosis not present

## 2021-09-09 DIAGNOSIS — D508 Other iron deficiency anemias: Secondary | ICD-10-CM

## 2021-09-09 DIAGNOSIS — Z30432 Encounter for removal of intrauterine contraceptive device: Secondary | ICD-10-CM

## 2021-09-09 MED ORDER — VITAMIN D (ERGOCALCIFEROL) 1.25 MG (50000 UNIT) PO CAPS: 50000.0000 [IU] | ORAL_CAPSULE | ORAL | 0 refills | Status: DC

## 2021-09-09 NOTE — Patient Instructions (Signed)

## 2021-09-09 NOTE — Progress Notes (Signed)
I,Tianna Badgett,acting as a Education administrator for [employer], NP.,have documented all relevant documentation on the behalf of [employer], NP,as directed by  Bary Castilla, NP while in the presence of Bary Castilla, NP.  This visit occurred during the SARS-CoV-2 public health emergency.  Safety protocols were in place, including screening questions prior to the visit, additional usage of staff PPE, and extensive cleaning of exam room while observing appropriate contact time as indicated for disinfecting solutions.  Subjective:     Patient ID: Rachael White , female    DOB: 11-13-1965 , 56 y.o.   MRN: [id number]   Chief Complaint  Patient presents with   Annual Exam    HPI  Patient is here for physical exam. She has seen the oncologist for swollen lymph nodes. She has gotten her mammogram this year. She is doing well. She has no concerns. She would like to stay on top of her health and screenings. She would also like to see a OBGYN because she still has her IUD in place. She is sexually active with one person. She does state she has hot flashes and would like to know if she is menopause. She takes her meds as directed. She is working on her diet.     Past Medical History:  Diagnosis Date   Allergy    Anxiety      Family History  Problem Relation Age of Onset   Cancer Father        prostate   Diabetes Mother    Heart disease Mother    Diabetes Brother    Diabetes Maternal Grandmother      Current Outpatient Medications:    aluminum & magnesium hydroxide-simethicone (MYLANTA) 500-450-40 MG/5ML suspension, Take 15 mLs by mouth every 6 (six) hours as needed. For stomach, Disp: , Rfl:    amphetamine-dextroamphetamine (ADDERALL) 30 MG tablet, TAKE 1 TABLET BY MOUTH IN THE MORNING AND 1 TAB AT NOON, Disp: , Rfl: 0   diazepam (VALIUM) 5 MG tablet, Take 5 mg by mouth every 8 (eight) hours as needed for anxiety. , Disp: , Rfl:    ketotifen (ZADITOR) 0.025 % ophthalmic  solution, Place 1-2 drops into both eyes 2 (two) times daily as needed (allergies.)., Disp: , Rfl:    venlafaxine XR (EFFEXOR-XR) 75 MG 24 hr capsule, Take 75 mg by mouth daily., Disp: , Rfl: 12   Vitamin D, Ergocalciferol, (DRISDOL) 1.25 MG (50000 UNIT) CAPS capsule, Take 1 capsule (50,000 Units total) by mouth every 7 (seven) days., Disp: 24 capsule, Rfl: 0   Allergies  Allergen Reactions   Penicillins Rash    Unknown childhood allergy.  Has patient had a PCN reaction causing immediate rash, facial/tongue/throat swelling, SOB or lightheadedness with hypotension: No Has patient had a PCN reaction causing severe rash involving mucus membranes or skin necrosis: No Has patient had a PCN reaction that required hospitalization: No Has patient had a PCN reaction occurring within the last 10 years: No If all of the above answers are "NO", then may proceed with Cephalosporin use.      The patient states she uses IUD for birth control. Last LMP was No LMP recorded. (Menstrual status: Perimenopausal).. Negative for Dysmenorrhea. Negative for: breast discharge, breast lump(s), breast pain and breast self exam. Associated symptoms include abnormal vaginal bleeding. Pertinent negatives include abnormal bleeding (hematology), anxiety, decreased libido, depression, difficulty falling sleep, dyspareunia, history of infertility, nocturia, sexual dysfunction, sleep disturbances, urinary incontinence, urinary urgency, vaginal discharge and vaginal itching. Diet regular.The  patient states her exercise level is    . The patient's tobacco use is:  Social History   Tobacco Use  Smoking Status Never  Smokeless Tobacco Never  . She has been exposed to passive smoke. The patient's alcohol use is:  Social History   Substance and Sexual Activity  Alcohol Use Yes   Alcohol/week: 1.0 - 3.0 standard drink   Types: 1 - 3 Glasses of wine per week   Comment: wine occ  . Additional information: Last pap , next one  scheduled for .    Review of Systems  Constitutional: Negative.  Negative for chills and fatigue.  HENT: Negative.  Negative for congestion.   Eyes: Negative.   Respiratory: Negative.  Negative for cough, shortness of breath and wheezing.   Cardiovascular: Negative.  Negative for chest pain and palpitations.  Gastrointestinal: Negative.  Negative for constipation, diarrhea and nausea.  Endocrine: Negative.  Negative for polydipsia, polyphagia and polyuria.  Genitourinary:  Positive for vaginal discharge.  Musculoskeletal: Negative.  Negative for arthralgias and myalgias.  Skin: Negative.   Allergic/Immunologic: Negative.   Neurological: Negative.  Negative for weakness, numbness and headaches.  Hematological: Negative.   Psychiatric/Behavioral: Negative.      Today's Vitals   09/09/21 1431  BP: 118/64  Pulse: 96  Temp: 98.8 F (37.1 C)  TempSrc: Oral  Weight: 228 lb (103.4 kg)  Height: 5' 6.4" (1.687 m)   Body mass index is 36.36 kg/m.  Wt Readings from Last 3 Encounters:  09/09/21 228 lb (103.4 kg)  06/11/21 232 lb 8 oz (105.5 kg)  06/08/21 228 lb (103.4 kg)    Objective:  Physical Exam Vitals and nursing note reviewed.  Constitutional:      Appearance: Normal appearance.  HENT:     Head: Normocephalic and atraumatic.     Right Ear: Tympanic membrane, ear canal and external ear normal. There is no impacted cerumen.     Left Ear: Tympanic membrane, ear canal and external ear normal. There is no impacted cerumen.     Nose: Nose normal.     Mouth/Throat:     Mouth: Mucous membranes are moist.     Pharynx: Oropharynx is clear.  Eyes:     Extraocular Movements: Extraocular movements intact.     Conjunctiva/sclera: Conjunctivae normal.     Pupils: Pupils are equal, round, and reactive to light.  Cardiovascular:     Rate and Rhythm: Normal rate and regular rhythm.     Pulses: Normal pulses.     Heart sounds: Normal heart sounds. No murmur heard. Pulmonary:      Effort: Pulmonary effort is normal. No respiratory distress.     Breath sounds: Normal breath sounds. No wheezing.  Chest:  Breasts:    Tanner Score is 5.     Right: Normal.     Left: Normal.  Abdominal:     General: Abdomen is flat. Bowel sounds are normal.     Palpations: Abdomen is soft.  Genitourinary:    Comments: deferred Musculoskeletal:        General: Normal range of motion.     Cervical back: Normal range of motion and neck supple.  Skin:    General: Skin is warm and dry.     Capillary Refill: Capillary refill takes less than 2 seconds.  Neurological:     General: No focal deficit present.     Mental Status: She is alert and oriented to person, place, and time.  Psychiatric:  Mood and Affect: Mood normal.        Behavior: Behavior normal.     Assessment And Plan:     1. Encounter for annual physical exam --Patient is here for their annual physical exam and we discussed any changes to medication and medical history.  -Behavior modification was discussed as well as diet and exercise history  -Patient will continue to exercise regularly and modify their diet.  -Recommendation for yearly physical annuals, immunization and screenings including mammogram and colonoscopy were discussed with the patient.  -Recommended intake of multivitamin, vitamin D and calcium.  -Individualized advise was given to the patient pertaining to their own health history in regards to diet, exercise, medical condition and referrals.  - Cytology -Pap Smear  2. Other iron deficiency anemia -Will check her labs  - CBC - CMP14+EGFR  3. Lymphadenopathy, inguinal -She has been seen by oncologist and followed by then every 6 months  -Benign   4. Encounter for hepatitis C screening test for low risk patient - Hepatitis C antibody  5. Screen for colon cancer - Ambulatory referral to Gastroenterology  6. Need for Tdap vaccination - Tdap vaccine greater than or equal to 7yo IM  7.  Vitamin D deficiency -Will check and supplement if needed. Advised patient to spend atleast 15 min. Daily in sunlight.  - Vitamin D (25 hydroxy)  8. Hot flashes -Pt. Would like to know if she is in menopause state due to her frequent hot flashes for the past couple of months.  -Will check her levels.  - [place] - LH  9. Encounter for IUD removal -She still has her IUD; would like to get it removed.  - Ambulatory referral to Obstetrics / Gynecology  10. Encounter for screening for metabolic disorder -Will assess for any metabolic disorder  - Hemoglobin A1c - Lipid panel  11. Vaginal discharge -No odor, no itching  - Urine cytology ancillary only  12. Class 2 obesity due to excess calories without serious comorbidity with body mass index (BMI) of 36.0 to 36.9 in adult -Advised patient on a healthy diet including avoiding fast food and red meats. Increase the intake of lean meats including grilled chicken and [place].  Drink a lot of water. Decrease intake of fatty foods. Exercise for 30-45 min. 4-5 a week to decrease the risk of cardiac event.   13. Need for shingles vaccine - Varicella-zoster vaccine IM (Shingrix) She is encouraged to strive for BMI less than 30 to decrease cardiac risk. Advised to aim for at least 150 minutes of exercise per week.  14. Attention deficit hyperactivity disorder (ADHD), unspecified ADHD type -Continue Adderall -Followed by specialist  The patient was encouraged to call or send a message through [facility] for any questions or concerns.   Follow up: if symptoms persist or do not get better.   Side effects and appropriate use of all the medication(s) were discussed with the patient today. Patient advised to use the medication(s) as directed by their healthcare provider. The patient was encouraged to read, review, and understand all associated package inserts and contact our office with any questions or concerns. The patient accepts the risks of the treatment  plan and had an opportunity to ask questions.   Staying healthy and adopting a healthy lifestyle for your overall health is important. You should eat 7 or more servings of fruits and vegetables per day. You should drink plenty of water to keep yourself hydrated and your kidneys healthy. This includes about 65-80+  fluid ounces of water. Limit your intake of animal fats especially for elevated cholesterol. Avoid highly processed food and limit your salt intake if you have hypertension. Avoid foods high in saturated/Trans fats. Along with a healthy diet it is also very important to maintain time for yourself to maintain a healthy mental health with low stress levels. You should get atleast 150 min of moderate intensity exercise weekly for a healthy heart. Along with eating right and exercising, aim for at least 7-9 hours of sleep daily.  Eat more whole grains which includes barley, wheat berries, oats, brown rice and whole wheat pasta. Use healthy plant oils which include olive, soy, corn, sunflower and peanut. Limit your caffeine and sugary drinks. Limit your intake of fast foods. Limit milk and dairy products to one or two daily servings.   Patient was given opportunity to ask questions. Patient verbalized understanding of the plan and was able to repeat key elements of the plan. All questions were answered to their satisfaction.  Raman Ranie Chinchilla, DNP   I, Raman Shailah Gibbins have reviewed all documentation for this visit. The documentation on 09/09/20 for the exam, diagnosis, procedures, and orders are all accurate and complete.    THE PATIENT IS ENCOURAGED TO PRACTICE SOCIAL DISTANCING DUE TO THE COVID-19 PANDEMIC.

## 2021-09-10 ENCOUNTER — Encounter: Payer: Self-pay | Admitting: Gastroenterology

## 2021-09-10 LAB — LIPID PANEL
Chol/HDL Ratio: 3 ratio (ref 0.0–4.4)
Cholesterol, Total: 192 mg/dL (ref 100–199)
HDL: 63 mg/dL (ref >39)
LDL Chol Calc (NIH): 117 mg/dL — ABNORMAL HIGH (ref 0–99)
Triglycerides: 67 mg/dL (ref 0–149)
VLDL Cholesterol Cal: 12 mg/dL (ref 5–40)

## 2021-09-10 LAB — CMP14+EGFR
ALT: 18 IU/L (ref 0–32)
AST: 16 IU/L (ref 0–40)
Albumin/Globulin Ratio: 1.4 (ref 1.2–2.2)
Albumin: 4.1 g/dL (ref 3.8–4.9)
Alkaline Phosphatase: 78 IU/L (ref 44–121)
BUN/Creatinine Ratio: 14 (ref 9–23)
BUN: 10 mg/dL (ref 6–24)
Bilirubin Total: 0.4 mg/dL (ref 0.0–1.2)
CO2: 24 mmol/L (ref 20–29)
Calcium: 9.5 mg/dL (ref 8.7–10.2)
Chloride: 104 mmol/L (ref 96–106)
Creatinine, Ser: 0.69 mg/dL (ref 0.57–1.00)
Globulin, Total: 2.9 g/dL (ref 1.5–4.5)
Glucose: 99 mg/dL (ref 70–99)
Potassium: 3.8 mmol/L (ref 3.5–5.2)
Sodium: 143 mmol/L (ref 134–144)
Total Protein: 7 g/dL (ref 6.0–8.5)
eGFR: 103 mL/min/{1.73_m2} (ref >59)

## 2021-09-10 LAB — FOLLICLE STIMULATING HORMONE: FSH: 53.9 m[IU]/mL

## 2021-09-10 LAB — CBC
Hematocrit: 38.3 % (ref 34.0–46.6)
Hemoglobin: 13.2 g/dL (ref 11.1–15.9)
MCH: 28.8 pg (ref 26.6–33.0)
MCHC: 34.5 g/dL (ref 31.5–35.7)
MCV: 84 fL (ref 79–97)
Platelets: 307 10*3/uL (ref 150–450)
RBC: 4.58 x10E6/uL (ref 3.77–5.28)
RDW: 13.2 % (ref 11.7–15.4)
WBC: 8.3 10*3/uL (ref 3.4–10.8)

## 2021-09-10 LAB — HEPATITIS C ANTIBODY: Hep C Virus Ab: <0.1 s/co ratio (ref 0.0–0.9)

## 2021-09-10 LAB — HEMOGLOBIN A1C
Est. average glucose Bld gHb Est-mCnc: 140 mg/dL
Hgb A1c MFr Bld: 6.5 % — ABNORMAL HIGH (ref 4.8–5.6)

## 2021-09-10 LAB — VITAMIN D 25 HYDROXY (VIT D DEFICIENCY, FRACTURES): Vit D, 25-Hydroxy: 47.3 ng/mL (ref 30.0–100.0)

## 2021-09-10 LAB — LUTEINIZING HORMONE: LH: 30.7 m[IU]/mL

## 2021-09-13 LAB — URINE CYTOLOGY ANCILLARY ONLY: Candida Urine: NEGATIVE

## 2021-09-13 LAB — CYTOLOGY - PAP: Diagnosis: NEGATIVE

## 2021-09-14 ENCOUNTER — Other Ambulatory Visit: Payer: Self-pay

## 2021-09-14 ENCOUNTER — Other Ambulatory Visit: Payer: Self-pay | Admitting: Nurse Practitioner

## 2021-09-14 DIAGNOSIS — E119 Type 2 diabetes mellitus without complications: Secondary | ICD-10-CM

## 2021-09-14 MED ORDER — ACCU-CHEK AVIVA PLUS W/DEVICE KIT: PACK | 3 refills | Status: AC

## 2021-09-14 MED ORDER — METFORMIN HCL 500 MG PO TABS: 500.0000 mg | ORAL_TABLET | Freq: Two times a day (BID) | ORAL | 11 refills | Status: DC

## 2021-10-04 ENCOUNTER — Ambulatory Visit (AMBULATORY_SURGERY_CENTER): Payer: No Typology Code available for payment source | Admitting: *Deleted

## 2021-10-04 ENCOUNTER — Other Ambulatory Visit: Payer: Self-pay

## 2021-10-04 VITALS — Ht 66.4 in | Wt 228.0 lb

## 2021-10-04 DIAGNOSIS — Z1211 Encounter for screening for malignant neoplasm of colon: Secondary | ICD-10-CM

## 2021-10-04 MED ORDER — NA SULFATE-K SULFATE-MG SULF 17.5-3.13-1.6 GM/177ML PO SOLN: 1.0000 | ORAL | 0 refills | Status: DC

## 2021-10-04 NOTE — Progress Notes (Signed)

## 2021-10-18 ENCOUNTER — Encounter: Payer: Self-pay | Admitting: Gastroenterology

## 2021-10-19 ENCOUNTER — Encounter: Payer: Self-pay | Admitting: Certified Registered Nurse Anesthetist

## 2021-10-20 ENCOUNTER — Encounter: Payer: Self-pay | Admitting: Gastroenterology

## 2021-10-20 ENCOUNTER — Other Ambulatory Visit: Payer: Self-pay

## 2021-10-20 ENCOUNTER — Ambulatory Visit (AMBULATORY_SURGERY_CENTER): Payer: No Typology Code available for payment source | Admitting: Gastroenterology

## 2021-10-20 VITALS — BP 106/87 | HR 77 | Temp 98.6°F | Resp 18 | Ht 66.0 in | Wt 228.0 lb

## 2021-10-20 DIAGNOSIS — Z1211 Encounter for screening for malignant neoplasm of colon: Secondary | ICD-10-CM | POA: Diagnosis not present

## 2021-10-20 MED ORDER — SODIUM CHLORIDE 0.9 % IV SOLN
500.0000 mL | Freq: Once | INTRAVENOUS | Status: DC
Start: 2021-10-20 — End: 2021-10-20

## 2021-10-20 NOTE — Progress Notes (Signed)
Referring Provider: Bary Castilla, NP Primary Care Physician:  Glendale Chard, MD  Reason for Procedure:  Colon cancer screening   IMPRESSION:  Need for colon cancer screening Appropriate candidate for monitored anesthesia care  PLAN: Colonoscopy in the [place] today   HPI: Rachael White is a 56 y.o. female presents for screening colonoscopy.  No prior colonoscopy or colon cancer screening.  No baseline GI symptoms. Recent hemorrhoidectomy.   No known family history of colon cancer or polyps. No family history of uterine/endometrial cancer, pancreatic cancer or gastric/stomach cancer.   Past Medical History:  Diagnosis Date   Allergy    Anemia    Anxiety     Past Surgical History:  Procedure Laterality Date   BREAST REDUCTION SURGERY     COSMETIC SURGERY     EVALUATION UNDER ANESTHESIA WITH HEMORRHOIDECTOMY N/A 04/29/2015   Procedure: EXAM UNDER ANESTHESIA WITH INTERNAL HEMORRHOID BANDING AND EXTERNAL HEMORRHOIDECTOMY;  Surgeon: Johnathan Hausen, MD;  Location: [facility];  Service: General;  Laterality: N/A;   GASTRIC BANDING PORT REVISION  09/04/2012   Procedure: GASTRIC BANDING PORT REVISION;  Surgeon: Pedro Earls, MD;  Location: [facility];  Service: General;  Laterality: N/A;  Lap Band Port Revision   KNEE ARTHROSCOPY     bilaterial   LAPAROSCOPIC GASTRIC BANDING  With vagotomy July 2007   Lapband vagotomy study patient   LAPAROSCOPIC REVISION OF GASTRIC BAND N/A 05/28/2013   Procedure: LAPAROSCOPIC REVISION OF GASTRIC BAND;  Surgeon: Pedro Earls, MD;  Location: [facility];  Service: General;  Laterality: N/A;  REMOVAL OF GASTRIC BAND AND INSERTION OF NEW AP BAND with MESH   NASAL SINUS SURGERY     TONSILLECTOMY     WRIST GANGLION EXCISION      Current Outpatient Medications  Medication Sig Dispense Refill   amphetamine-dextroamphetamine (ADDERALL) 30 MG tablet TAKE 1 TABLET BY MOUTH IN THE MORNING AND 1 TAB AT NOON  0   venlafaxine XR (EFFEXOR-XR) 75 MG 24  hr capsule Take 75 mg by mouth daily.  12   Vitamin D, Ergocalciferol, (DRISDOL) 1.25 MG (50000 UNIT) CAPS capsule Take 1 capsule (50,000 Units total) by mouth every 7 (seven) days. 24 capsule 0   aluminum & magnesium hydroxide-simethicone (MYLANTA) 500-450-40 MG/5ML suspension Take 15 mLs by mouth every 6 (six) hours as needed. For stomach     Blood Glucose Monitoring Suppl (ACCU-CHEK AVIVA PLUS) w/Device KIT Use to check blood sugar twice a day. 1 kit 3   diazepam (VALIUM) 5 MG tablet Take 5 mg by mouth every 8 (eight) hours as needed for anxiety.      ketotifen (ZADITOR) 0.025 % ophthalmic solution Place 1-2 drops into both eyes 2 (two) times daily as needed (allergies.).     metFORMIN (GLUCOPHAGE) 500 MG tablet Take 1 tablet (500 mg total) by mouth 2 (two) times daily with a meal. (Patient not taking: Reported on 10/04/2021) 60 tablet 11   Current Facility-Administered Medications  Medication Dose Route Frequency Provider Last Rate Last Admin   0.9 %  sodium chloride infusion  500 mL Intravenous Once [place], MD        Allergies as of 10/20/2021 - Review Complete 10/20/2021  Allergen Reaction Noted   Penicillins Rash 02/11/2012    Family History  Problem Relation Age of Onset   Diabetes Mother    Heart disease Mother    Cancer Father        prostate   Diabetes Brother  Pancreatic cancer Maternal Aunt    Diabetes Maternal Grandmother    Colon polyps Neg Hx    Colon cancer Neg Hx      Physical Exam: General:   Alert,  well-nourished, pleasant and cooperative in NAD Head:  Normocephalic and atraumatic. Eyes:  Sclera clear, no icterus.   Conjunctiva pink. Mouth:  No deformity or lesions.   Neck:  Supple; no masses or thyromegaly. Lungs:  Clear throughout to auscultation.   No wheezes. Heart:  Regular rate and rhythm; no murmurs. Abdomen:  Soft, non-tender, nondistended, normal bowel sounds, no rebound or guarding.  Msk:  Symmetrical. No boney deformities LAD: No  inguinal or umbilical LAD Extremities:  No clubbing or edema. Neurologic:  Alert and  oriented x4;  grossly nonfocal Skin:  No obvious rash or bruise. Psych:  Alert and cooperative. Normal mood and affect.     Studies/Results: No results found.    Zareth L. Tarri Glenn, MD, MPH 10/20/2021, 3:14 PM

## 2021-10-20 NOTE — Progress Notes (Signed)
Vitals-DT  Pt's states no medical or surgical changes since previsit or office visit.  

## 2021-10-20 NOTE — Progress Notes (Signed)
Report given to PACU, vss 

## 2021-10-20 NOTE — Op Note (Addendum)
[facility] Patient Name: Rachael White Procedure Date: 10/20/2021 3:08 PM MRN: [id number] Endoscopist: Tressia Danas MD, MD Age: 56 Referring MD:  Date of Birth: February 14, 1966 Gender: Female Account #: [id number] Procedure:                Colonoscopy Indications:              Screening for colorectal malignant neoplasm, This                            is the patient's first colonoscopy                           No known family history of colon cancer or polyps Medicines:                Monitored Anesthesia Care Procedure:                Pre-Anesthesia Assessment:                           - Prior to the procedure, a History and Physical                            was performed, and patient medications and                            allergies were reviewed. The patient's tolerance of                            previous anesthesia was also reviewed. The risks                            and benefits of the procedure and the sedation                            options and risks were discussed with the patient.                            All questions were answered, and informed consent                            was obtained. Prior Anticoagulants: The patient has                            taken no previous anticoagulant or antiplatelet                            agents. ASA Grade Assessment: II - A patient with                            mild systemic disease. After reviewing the risks                            and benefits, the patient was deemed in  satisfactory condition to undergo the procedure.                           After obtaining informed consent, the colonoscope                            was passed under direct vision. Throughout the                            procedure, the patient's blood pressure, pulse, and                            oxygen saturations were monitored continuously. The                            CF [id number] #[id number]  was introduced through the anus                            and advanced to the 2 cm into the ileum. A second                            forward view of the right colon was performed. The                            colonoscopy was performed with moderate difficulty                            due to a redundant colon and significant looping.                            Successful completion of the procedure was aided by                            withdrawing and reinserting the scope. The patient                            tolerated the procedure. The quality of the bowel                            preparation was good. The terminal ileum, ileocecal                            valve, appendiceal orifice, and rectum were                            photographed. Scope In: 3:23:16 PM Scope Out: 3:36:23 PM Scope Withdrawal Time: 0 hours 7 minutes 51 seconds  Total Procedure Duration: 0 hours 13 minutes 7 seconds  Findings:                 The perianal and digital rectal examinations were                            normal.  Many small and large-mouthed diverticula were found                            in the sigmoid colon and descending colon.                           The exam was otherwise without abnormality on                            direct and retroflexion views except for small                            internal hemorrhoids. Complications:            Bradycardia responding to Robinul 0.2 mg Estimated Blood Loss:     Estimated blood loss: none. Impression:               - Diverticulosis in the sigmoid colon and in the                            descending colon.                           - The examination was otherwise normal on direct                            and retroflexion views.                           - No specimens collected. Recommendation:           - Patient has a contact number available for                            emergencies. The signs and  symptoms of potential                            delayed complications were discussed with the                            patient. Return to normal activities tomorrow.                            Written discharge instructions were provided to the                            patient.                           - Resume previous diet.                           - Continue present medications.                           - Repeat colonoscopy in 10 years for surveillance,  earlier with new symptoms.                           - Emerging evidence supports eating a diet of                            fruits, vegetables, grains, calcium, and yogurt                            while reducing red meat and alcohol may reduce the                            risk of colon cancer.                           - Thank you for allowing me to be involved in your                            colon cancer prevention. Tressia DanasKimberly Sohum Delillo MD, MD 10/20/2021 3:44:35 PM This report has been signed electronically.

## 2021-10-20 NOTE — Patient Instructions (Signed)
Resume previous diet and medications. Repeat Colonoscopy in 10 years for surveillance.  YOU HAD AN ENDOSCOPIC PROCEDURE TODAY AT THE [facility]:   Refer to the procedure report that was given to you for any specific questions about what was found during the examination.  If the procedure report does not answer your questions, please call your gastroenterologist to clarify.  If you requested that your care partner not be given the details of your procedure findings, then the procedure report has been included in a sealed envelope for you to review at your convenience later.  YOU SHOULD EXPECT: Some feelings of bloating in the abdomen. Passage of more gas than usual.  Walking can help get rid of the air that was put into your GI tract during the procedure and reduce the bloating. If you had a lower endoscopy (such as a colonoscopy or flexible sigmoidoscopy) you may notice spotting of blood in your stool or on the toilet paper. If you underwent a bowel prep for your procedure, you may not have a normal bowel movement for a few days.  Please Note:  You might notice some irritation and congestion in your nose or some drainage.  This is from the oxygen used during your procedure.  There is no need for concern and it should clear up in a day or so.  SYMPTOMS TO REPORT IMMEDIATELY:  Following lower endoscopy (colonoscopy or flexible sigmoidoscopy):  Excessive amounts of blood in the stool  Significant tenderness or worsening of abdominal pains  Swelling of the abdomen that is new, acute  Fever of 100F or higher  For urgent or emergent issues, a gastroenterologist can be reached at any hour by calling [phone]. Do not use MyChart messaging for urgent concerns.    DIET:  We do recommend a small meal at first, but then you may proceed to your regular diet.  Drink plenty of fluids but you should avoid alcoholic beverages for 24 hours.  ACTIVITY:  You should plan to take it easy for the  rest of today and you should NOT DRIVE or use heavy machinery until tomorrow (because of the sedation medicines used during the test).    FOLLOW UP: Our staff will call the number listed on your records 48-72 hours following your procedure to check on you and address any questions or concerns that you may have regarding the information given to you following your procedure. If we do not reach you, we will leave a message.  We will attempt to reach you two times.  During this call, we will ask if you have developed any symptoms of COVID 19. If you develop any symptoms (ie: fever, flu-like symptoms, shortness of breath, cough etc.) before then, please call [phone].  If you test positive for Covid 19 in the 2 weeks post procedure, please call and report this information to us.    If any biopsies were taken you will be contacted by phone or by letter within the next 1-3 weeks.  Please call us at [phone] if you have not heard about the biopsies in 3 weeks.    SIGNATURES/CONFIDENTIALITY: You and/or your care partner have signed paperwork which will be entered into your electronic medical record.  These signatures attest to the fact that that the information above on your After Visit Summary has been reviewed and is understood.  Full responsibility of the confidentiality of this discharge information lies with you and/or your care-partner.  

## 2021-10-20 NOTE — Progress Notes (Signed)
1528 Pt experienced bradycardia with rates in the 20s and approximately 6 second pause, scope pulled back with HR increasing to > 50 beats per minute. Robinul 0.2 mg given IV. MD updated, vss.  Scope passed after 1 to 2 minutes after Robinul given without problems.

## 2021-10-22 ENCOUNTER — Telehealth: Payer: Self-pay | Admitting: *Deleted

## 2021-10-22 NOTE — Telephone Encounter (Signed)
°  Follow up Call-  Call back number 10/20/2021  Post procedure Call Back phone  # [phone]  Permission to leave phone message Yes  Some recent data might be hidden     Patient questions:  Do you have a fever, pain , or abdominal swelling? No. Pain Score  0 *  Have you tolerated food without any problems? Yes.    Have you been able to return to your normal activities? Yes.    Do you have any questions about your discharge instructions: Diet   No. Medications  No. Follow up visit  No.  Do you have questions or concerns about your Care? No.  Actions: * If pain score is 4 or above: No action needed, pain <4.

## 2021-12-10 ENCOUNTER — Other Ambulatory Visit: Payer: Self-pay | Admitting: Hematology and Oncology

## 2021-12-10 ENCOUNTER — Inpatient Hospital Stay: Payer: No Typology Code available for payment source | Admitting: Hematology and Oncology

## 2021-12-10 ENCOUNTER — Inpatient Hospital Stay: Payer: No Typology Code available for payment source

## 2021-12-10 DIAGNOSIS — R590 Localized enlarged lymph nodes: Secondary | ICD-10-CM

## 2021-12-13 ENCOUNTER — Ambulatory Visit: Payer: No Typology Code available for payment source | Admitting: Obstetrics and Gynecology

## 2021-12-15 ENCOUNTER — Encounter: Payer: Self-pay | Admitting: Nurse Practitioner

## 2021-12-15 ENCOUNTER — Ambulatory Visit (INDEPENDENT_AMBULATORY_CARE_PROVIDER_SITE_OTHER): Payer: No Typology Code available for payment source | Admitting: Nurse Practitioner

## 2021-12-15 VITALS — BP 130/70 | HR 92 | Temp 97.5°F | Ht 66.0 in | Wt 230.0 lb

## 2021-12-15 DIAGNOSIS — E119 Type 2 diabetes mellitus without complications: Secondary | ICD-10-CM | POA: Diagnosis not present

## 2021-12-15 DIAGNOSIS — Z6837 Body mass index (BMI) 37.0-37.9, adult: Secondary | ICD-10-CM

## 2021-12-15 DIAGNOSIS — Z23 Encounter for immunization: Secondary | ICD-10-CM

## 2021-12-15 DIAGNOSIS — J302 Other seasonal allergic rhinitis: Secondary | ICD-10-CM

## 2021-12-15 MED ORDER — DESLORATADINE 5 MG PO TABS: 5.0000 mg | ORAL_TABLET | Freq: Every day | ORAL | 2 refills | Status: DC

## 2021-12-15 MED ORDER — ACCU-CHEK GUIDE ME W/DEVICE KIT: PACK | 1 refills | Status: AC

## 2021-12-15 MED ORDER — ACCU-CHEK SOFTCLIX LANCETS MISC: 2 refills | Status: DC

## 2021-12-15 MED ORDER — ACCU-CHEK GUIDE VI STRP: ORAL_STRIP | 2 refills | Status: AC

## 2021-12-15 NOTE — Patient Instructions (Addendum)
Diabetes Mellitus and Exercise ?Exercising regularly is important for overall health, especially for people who have diabetes mellitus. Exercising is not only about losing weight. It has many other health benefits, such as increasing muscle strength and bone density and reducing body fat and stress. This leads to improved fitness, flexibility, and endurance, all of which result in better overall health. ?What are the benefits of exercise if I have diabetes? ?Exercise has many benefits for people with diabetes. They include: ?Helping to lower and control blood sugar (glucose). ?Helping the body to respond better to the hormone insulin by improving insulin sensitivity. ?Reducing how much insulin the body needs. ?Lowering the risk for heart disease by: ?Lowering "bad" cholesterol and triglyceride levels. ?Increasing "good" cholesterol levels. ?Lowering blood pressure. ?Lowering blood glucose levels. ?What is my activity plan? ?Your health care provider or certified diabetes educator can help you make a plan for the type and frequency of exercise that works for you. This is called your activity plan. Be sure to: ?Get at least 150 minutes of medium-intensity or high-intensity exercise each week. Exercises may include brisk walking, biking, or water aerobics. ?Do stretching and strengthening exercises, such as yoga or weight lifting, at least 2 times a week. ?Spread out your activity over at least 3 days of the week. ?Get some form of physical activity each day. ?Do not go more than 2 days in a row without some kind of physical activity. ?Avoid being inactive for more than 90 minutes at a time. Take frequent breaks to walk or stretch. ?Choose exercises or activities that you enjoy. Set realistic goals. ?Start slowly and gradually increase your exercise intensity over time. ?How do I manage my diabetes during exercise? ? ?Monitor your blood glucose ?Check your blood glucose before and after exercising. If your blood  glucose is: ?240 mg/dL (13.3 mmol/L) or higher before you exercise, check your urine for ketones. These are chemicals created by the liver. If you have ketones in your urine, do not exercise until your blood glucose returns to normal. ?100 mg/dL (5.6 mmol/L) or lower, eat a snack containing 15-20 grams of carbohydrate. Check your blood glucose 15 minutes after the snack to make sure that your glucose level is above 100 mg/dL (5.6 mmol/L) before you start your exercise. ?Know the symptoms of low blood glucose (hypoglycemia) and how to treat it. Your risk for hypoglycemia increases during and after exercise. ?Follow these tips and your health care provider's instructions ?Keep a carbohydrate snack that is fast-acting for use before, during, and after exercise to help prevent or treat hypoglycemia. ?Avoid injecting insulin into areas of the body that are going to be exercised. For example, avoid injecting insulin into: ?Your arms, when you are about to play tennis. ?Your legs, when you are about to go jogging. ?Keep records of your exercise habits. Doing this can help you and your health care provider adjust your diabetes management plan as needed. Write down: ?Food that you eat before and after you exercise. ?Blood glucose levels before and after you exercise. ?The type and amount of exercise you have done. ?Work with your health care provider when you start a new exercise or activity. He or she may need to: ?Make sure that the activity is safe for you. ?Adjust your insulin, other medicines, and food that you eat. ?Drink plenty of water while you exercise. This prevents loss of water (dehydration) and problems caused by a lot of heat in the body (heat stroke). ?Where to find more   information ?American Diabetes Association: www.diabetes.org ?Summary ?Exercising regularly is important for overall health, especially for people who have diabetes mellitus. ?Exercising has many health benefits. It increases muscle strength  and bone density and reduces body fat and stress. It also lowers and controls blood glucose. ?Your health care provider or certified diabetes educator can help you make an activity plan for the type and frequency of exercise that works for you. ?Work with your health care provider to make sure any new activity is safe for you. Also work with your health care provider to adjust your insulin, other medicines, and the food you eat. ?This information is not intended to replace advice given to you by your health care provider. Make sure you discuss any questions you have with your health care provider. ?Document Revised: 05/13/2019 Document Reviewed: 05/13/2019 ?Elsevier Patient Education ? 2023 Elsevier Inc. ? ? ?Zoster Vaccine, Recombinant injection ?What is this medication? ?ZOSTER VACCINE (ZOS ter vak SEEN) is a vaccine used to reduce the risk of getting shingles. This vaccine is not used to treat shingles or nerve pain from shingles. ?This medicine may be used for other purposes; ask your health care provider or pharmacist if you have questions. ?COMMON BRAND NAME(S): SHINGRIX ?What should I tell my care team before I take this medication? ?They need to know if you have any of these conditions: ?cancer ?immune system problems ?an unusual or allergic reaction to Zoster vaccine, other medications, foods, dyes, or preservatives ?pregnant or trying to get pregnant ?breast-feeding ?How should I use this medication? ?This vaccine is injected into a muscle. It is given by a health care provider. ?A copy of Vaccine Information Statements will be given before each vaccination. Be sure to read this information carefully each time. This sheet may change often. ?Talk to your health care provider about the use of this vaccine in children. This vaccine is not approved for use in children. ?Overdosage: If you think you have taken too much of this medicine contact a poison control center or emergency room at once. ?NOTE: This  medicine is only for you. Do not share this medicine with others. ?What if I miss a dose? ?Keep appointments for follow-up (booster) doses. It is important not to miss your dose. Call your health care provider if you are unable to keep an appointment. ?What may interact with this medication? ?medicines that suppress your immune system ?medicines to treat cancer ?steroid medicines like prednisone or cortisone ?This list may not describe all possible interactions. Give your health care provider a list of all the medicines, herbs, non-prescription drugs, or dietary supplements you use. Also tell them if you smoke, drink alcohol, or use illegal drugs. Some items may interact with your medicine. ?What should I watch for while using this medication? ?Visit your health care provider regularly. ?This vaccine, like all vaccines, may not fully protect everyone. ?What side effects may I notice from receiving this medication? ?Side effects that you should report to your doctor or health care professional as soon as possible: ?allergic reactions (skin rash, itching or hives; swelling of the face, lips, or tongue) ?trouble breathing ?Side effects that usually do not require medical attention (report these to your doctor or health care professional if they continue or are bothersome): ?chills ?headache ?fever ?nausea ?pain, redness, or irritation at site where injected ?tiredness ?vomiting ?This list may not describe all possible side effects. Call your doctor for medical advice about side effects. You may report side effects to FDA at 1-800-FDA-1088. ?  Where should I keep my medication? ?This vaccine is only given by a health care provider. It will not be stored at home. ?NOTE: This sheet is a summary. It may not cover all possible information. If you have questions about this medicine, talk to your doctor, pharmacist, or health care provider. ?? 2023 Elsevier/Gold Standard (2021-07-16 00:00:00) ? ?

## 2021-12-15 NOTE — Progress Notes (Signed)
?Industrial/product designer as a Education administrator for [employer], FNP.,have documented all relevant documentation on the behalf of Minette Brine, FNP,as directed by  Minette Brine, FNP while in the presence of Minette Brine, [place]. ? ?This visit occurred during the SARS-CoV-2 public health emergency.  Safety protocols were in place, including screening questions prior to the visit, additional usage of staff PPE, and extensive cleaning of exam room while observing appropriate contact time as indicated for disinfecting solutions. ? ?Subjective:  ?  ? Patient ID: Rachael White , female    DOB: 01/27/1966 , 56 y.o.   MRN: [id number] ? ? ?Chief Complaint  ?Patient presents with  ? Diabetes  ? ? ?HPI ? ?Patient presents for DM follow up. She had been on metformin since January.   ? ?Wt Readings from Last 3 Encounters: ?12/15/21 : 230 lb (104.3 kg) ?10/20/21 : 228 lb (103.4 kg) ?10/04/21 : 228 lb (103.4 kg) ? ? ? ?Diabetes ?She presents for her follow-up diabetic visit. She has type 2 diabetes mellitus. There are no hypoglycemic associated symptoms. There are no diabetic associated symptoms. Diabetic meal planning: regular - she is trying to decrease her intake of sodas and salt. And increase protein. She participates in exercise three times a week. (Has not been taking her blood sugar - will pick up glucometer from [place]) She does not see a podiatrist.Eye exam is not current.   ? ?Past Medical History:  ?Diagnosis Date  ? Allergy   ? Anemia   ? Anxiety   ?  ? ?Family History  ?Problem Relation Age of Onset  ? Diabetes Mother   ? Heart disease Mother   ? Cancer Father   ?     prostate  ? Diabetes Brother   ? Pancreatic cancer Maternal Aunt   ? Diabetes Maternal Grandmother   ? Colon polyps Neg Hx   ? Colon cancer Neg Hx   ? ? ? ?Current Outpatient Medications:  ?  aluminum & magnesium hydroxide-simethicone (MYLANTA) 500-450-40 MG/5ML suspension, Take 15 mLs by mouth every 6 (six) hours as needed. For stomach, Disp: , Rfl:  ?   amphetamine-dextroamphetamine (ADDERALL) 30 MG tablet, TAKE 1 TABLET BY MOUTH IN THE MORNING AND 1 TAB AT NOON, Disp: , Rfl: 0 ?  Blood Glucose Monitoring Suppl (ACCU-CHEK AVIVA PLUS) w/Device KIT, Use to check blood sugar twice a day., Disp: 1 kit, Rfl: 3 ?  Blood Glucose Monitoring Suppl (ACCU-CHEK GUIDE ME) w/Device KIT, Use to check blood sugars twice daily E11.69, Disp: 1 kit, Rfl: 1 ?  desloratadine (CLARINEX) 5 MG tablet, Take 1 tablet (5 mg total) by mouth daily., Disp: 30 tablet, Rfl: 2 ?  diazepam (VALIUM) 5 MG tablet, Take 5 mg by mouth every 8 (eight) hours as needed for anxiety. , Disp: , Rfl:  ?  glucose blood (ACCU-CHEK GUIDE) test strip, Use as instructed to check blood sugars twice daily E11.69, Disp: 100 each, Rfl: 2 ?  ketotifen (ZADITOR) 0.025 % ophthalmic solution, Place 1-2 drops into both eyes 2 (two) times daily as needed (allergies.)., Disp: , Rfl:  ?  venlafaxine XR (EFFEXOR-XR) 75 MG 24 hr capsule, Take 75 mg by mouth daily., Disp: , Rfl: 12 ?  Vitamin D, Ergocalciferol, (DRISDOL) 1.25 MG (50000 UNIT) CAPS capsule, Take 1 capsule (50,000 Units total) by mouth every 7 (seven) days., Disp: 24 capsule, Rfl: 0 ?  Accu-Chek Softclix Lancets lancets, Use as instructed to check blood sugars twice daily E11.69, Disp: 100 each, Rfl: 2 ?  glucose blood (ACCU-CHEK GUIDE) test strip, Use as instructed, Disp: 100 each, Rfl: 12 ?  Lancets Misc. (ACCU-CHEK FASTCLIX LANCET) KIT, Use with accu-chek guide me, Disp: 1 kit, Rfl: 3  ? ?Allergies  ?Allergen Reactions  ? Penicillins Rash  ?  Unknown childhood allergy.  ?Has patient had a PCN reaction causing immediate rash, facial/tongue/throat swelling, SOB or lightheadedness with hypotension: No ?Has patient had a PCN reaction causing severe rash involving mucus membranes or skin necrosis: No ?Has patient had a PCN reaction that required hospitalization: No ?Has patient had a PCN reaction occurring within the last 10 years: No ?If all of the above answers are  "NO", then may proceed with Cephalosporin use.  ?  ? ?Review of Systems  ?Constitutional: Negative.   ?Respiratory: Negative.    ?Cardiovascular: Negative.   ?Gastrointestinal: Negative.   ?Neurological: Negative.   ?Psychiatric/Behavioral: Negative.     ? ?Today's Vitals  ? 12/15/21 0919  ?BP: 130/70  ?Pulse: 92  ?Temp: (!) 97.5 ?F (36.4 ?C)  ?TempSrc: Oral  ?Weight: 230 lb (104.3 kg)  ?Height: $RemoveB'5\' 6"'iYQSDAxW$  (1.676 m)  ? ?Body mass index is 37.12 kg/m?.  ?Wt Readings from Last 3 Encounters:  ?12/15/21 230 lb (104.3 kg)  ?10/20/21 228 lb (103.4 kg)  ?10/04/21 228 lb (103.4 kg)  ? ? ?Objective:  ?Physical Exam ?Vitals reviewed.  ?Constitutional:   ?   General: She is not in acute distress. ?   Appearance: Normal appearance. She is well-developed. She is obese.  ?Cardiovascular:  ?   Rate and Rhythm: Normal rate and regular rhythm.  ?   Pulses: Normal pulses.  ?   Heart sounds: Normal heart sounds. No murmur heard. ?Pulmonary:  ?   Effort: Pulmonary effort is normal. No respiratory distress.  ?   Breath sounds: Normal breath sounds. No wheezing.  ?Chest:  ?   Chest wall: No tenderness.  ?Musculoskeletal:     ?   General: Normal range of motion.  ?Skin: ?   General: Skin is warm and dry.  ?   Capillary Refill: Capillary refill takes less than 2 seconds.  ?Neurological:  ?   General: No focal deficit present.  ?   Mental Status: She is alert and oriented to person, place, and time.  ?   Cranial Nerves: No cranial nerve deficit.  ?   Motor: No weakness.  ?Psychiatric:     ?   Mood and Affect: Mood normal.     ?   Behavior: Behavior normal.     ?   Thought Content: Thought content normal.     ?   Judgment: Judgment normal.  ?  ? ?   ?Assessment And Plan:  ?   ?1. Type 2 diabetes mellitus without complication, without long-term current use of insulin ([place]) ?Comments: Will check HgbA1c, encouraged to limit intake of sugary foods and drinks. Increase physical activity to at least 150 minutes a week. ?Glucometer sent to pharmacy ?-  Hemoglobin A1c ?- Renal function panel with eGFR ? ?2. Seasonal allergies ?- desloratadine (CLARINEX) 5 MG tablet; Take 1 tablet (5 mg total) by mouth daily.  Dispense: 30 tablet; Refill: 2 ? ?3. Class 2 severe obesity due to excess calories with serious comorbidity and body mass index (BMI) of 37.0 to 37.9 in adult [facility]) ?She is encouraged to strive for BMI less than 30 to decrease cardiac risk. Advised to aim for at least 150 minutes of exercise per week. ? ?4. Need for shingles vaccine ?  Shingrix #1 administered in office ?- Varicella-zoster vaccine IM (Shingrix) ? ? ? ? ? ?Patient was given opportunity to ask questions. Patient verbalized understanding of the plan and was able to repeat key elements of the plan. All questions were answered to their satisfaction.  ?Minette Brine, FNP  ? ? ?I, Minette Brine, FNP, have reviewed all documentation for this visit. The documentation on 12/15/21 for the exam, diagnosis, procedures, and orders are all accurate and complete.  ? ?IF YOU HAVE BEEN REFERRED TO A SPECIALIST, IT MAY TAKE 1-2 WEEKS TO SCHEDULE/PROCESS THE REFERRAL. IF YOU HAVE NOT HEARD FROM US/SPECIALIST IN TWO WEEKS, PLEASE GIVE [place] A CALL AT [phone] X [phone].  ? ?THE PATIENT IS ENCOURAGED TO PRACTICE SOCIAL DISTANCING DUE TO THE COVID-19 PANDEMIC.   ?

## 2021-12-16 LAB — HEMOGLOBIN A1C
Est. average glucose Bld gHb Est-mCnc: 146 mg/dL
Hgb A1c MFr Bld: 6.7 % — ABNORMAL HIGH (ref 4.8–5.6)

## 2021-12-16 LAB — RENAL FUNCTION PANEL
Albumin: 4 g/dL (ref 3.8–4.9)
BUN/Creatinine Ratio: 9 (ref 9–23)
BUN: 7 mg/dL (ref 6–24)
CO2: 27 mmol/L (ref 20–29)
Calcium: 9.6 mg/dL (ref 8.7–10.2)
Chloride: 101 mmol/L (ref 96–106)
Creatinine, Ser: 0.74 mg/dL (ref 0.57–1.00)
Glucose: 93 mg/dL (ref 70–99)
Phosphorus: 3.7 mg/dL (ref 3.0–4.3)
Potassium: 4.4 mmol/L (ref 3.5–5.2)
Sodium: 138 mmol/L (ref 134–144)
eGFR: 96 mL/min/{1.73_m2} (ref >59)

## 2021-12-21 ENCOUNTER — Encounter: Payer: Self-pay | Admitting: Nurse Practitioner

## 2021-12-21 ENCOUNTER — Other Ambulatory Visit: Payer: Self-pay

## 2021-12-21 MED ORDER — ACCU-CHEK FASTCLIX LANCET KIT: PACK | 3 refills | Status: AC

## 2021-12-21 MED ORDER — ACCU-CHEK GUIDE VI STRP
ORAL_STRIP | 12 refills | Status: AC
Start: 2021-12-21 — End: ?

## 2021-12-21 MED ORDER — ACCU-CHEK SOFTCLIX LANCETS MISC
2 refills | Status: AC
Start: 2021-12-21 — End: ?

## 2022-01-09 LAB — HM DIABETES EYE EXAM

## 2022-01-11 ENCOUNTER — Other Ambulatory Visit: Payer: Self-pay | Admitting: Nurse Practitioner

## 2022-01-11 DIAGNOSIS — E1169 Type 2 diabetes mellitus with other specified complication: Secondary | ICD-10-CM

## 2022-01-11 MED ORDER — OZEMPIC (0.25 OR 0.5 MG/DOSE) 2 MG/1.5ML ~~LOC~~ SOPN
0.5000 mg | PEN_INJECTOR | SUBCUTANEOUS | 1 refills | Status: DC
Start: 2022-01-11 — End: 2022-03-10

## 2022-01-11 NOTE — Progress Notes (Signed)
I have sent in Ozempic if we have a sample you can have her to come in for teaching and give a sample. If not she will need to pick up at the pharmacy. She needs to take 0.25 mg weekly for 4 weeks then increase to 0.5 mg weekly. Make sure she has a f/u in 4-6 weeks for medication check

## 2022-03-09 ENCOUNTER — Telehealth: Payer: Self-pay

## 2022-03-09 ENCOUNTER — Ambulatory Visit: Payer: No Typology Code available for payment source | Admitting: Nurse Practitioner

## 2022-03-09 NOTE — Telephone Encounter (Signed)
Patient notified that her FMLA has been completed and faxed. Pt stated she will pickup a copy tomorrow when she is here for her appt. YL,RMA

## 2022-03-10 ENCOUNTER — Encounter: Payer: Self-pay | Admitting: Nurse Practitioner

## 2022-03-10 ENCOUNTER — Ambulatory Visit (INDEPENDENT_AMBULATORY_CARE_PROVIDER_SITE_OTHER): Payer: No Typology Code available for payment source | Admitting: Nurse Practitioner

## 2022-03-10 VITALS — BP 140/92 | HR 80 | Temp 97.9°F | Ht 66.0 in | Wt 202.0 lb

## 2022-03-10 DIAGNOSIS — R030 Elevated blood-pressure reading, without diagnosis of hypertension: Secondary | ICD-10-CM

## 2022-03-10 DIAGNOSIS — E1169 Type 2 diabetes mellitus with other specified complication: Secondary | ICD-10-CM | POA: Diagnosis not present

## 2022-03-10 DIAGNOSIS — E559 Vitamin D deficiency, unspecified: Secondary | ICD-10-CM | POA: Diagnosis not present

## 2022-03-10 DIAGNOSIS — Z6832 Body mass index (BMI) 32.0-32.9, adult: Secondary | ICD-10-CM

## 2022-03-10 MED ORDER — SEMAGLUTIDE (1 MG/DOSE) 4 MG/3ML ~~LOC~~ SOPN: 1.0000 mg | PEN_INJECTOR | SUBCUTANEOUS | 1 refills | Status: DC

## 2022-03-10 MED ORDER — MAGNESIUM 250 MG PO TABS: 1.0000 | ORAL_TABLET | Freq: Every day | ORAL | 2 refills | Status: DC

## 2022-03-10 NOTE — Progress Notes (Signed)
I,Tianna Badgett,acting as a Education administrator for [employer], FNP.,have documented all relevant documentation on the behalf of Minette Brine, FNP,as directed by  Minette Brine, FNP while in the presence of Minette Brine, [place].  Subjective:     Patient ID: Rachael White , female    DOB: [birth year] , 56 y.o.   MRN: [id number]   Chief Complaint  Patient presents with   Diabetes   vitamin d     HPI  Patient presents for DM follow up. She is taking Ozempic tolerating well. She is exercising 4 times a week. Appetite has decreased. She is also drinking adequate amounts of water. She does not have a taste for sweets. She had lap band surgery in 2007.   Wt Readings from Last 3 Encounters: 03/10/22 : 202 lb (91.6 kg) 12/15/21 : 230 lb (104.3 kg) 10/20/21 : 228 lb (103.4 kg)    Diabetes She presents for her follow-up diabetic visit. She has type 2 diabetes mellitus. There are no hypoglycemic associated symptoms. There are no diabetic associated symptoms. Pertinent negatives for diabetes include no fatigue. Diabetic meal planning: regular - she is trying to decrease her intake of sodas and salt. And increase protein. She participates in exercise three times a week. (Has not been taking her blood sugar - will pick up glucometer from [place]) She does not see a podiatrist.Eye exam is not current.     Past Medical History:  Diagnosis Date   Allergy    Anemia    Anxiety      Family History  Problem Relation Age of Onset   Diabetes Mother    Heart disease Mother    Cancer Father        prostate   Diabetes Brother    Pancreatic cancer Maternal Aunt    Diabetes Maternal Grandmother    Colon polyps Neg Hx    Colon cancer Neg Hx      Current Outpatient Medications:    Accu-Chek Softclix Lancets lancets, Use as instructed to check blood sugars twice daily E11.69, Disp: 100 each, Rfl: 2   aluminum & magnesium hydroxide-simethicone (MYLANTA) 500-450-40 MG/5ML suspension, Take 15 mLs by  mouth every 6 (six) hours as needed. For stomach, Disp: , Rfl:    amphetamine-dextroamphetamine (ADDERALL) 30 MG tablet, TAKE 1 TABLET BY MOUTH IN THE MORNING AND 1 TAB AT NOON, Disp: , Rfl: 0   Blood Glucose Monitoring Suppl (ACCU-CHEK AVIVA PLUS) w/Device KIT, Use to check blood sugar twice a day., Disp: 1 kit, Rfl: 3   Blood Glucose Monitoring Suppl (ACCU-CHEK GUIDE ME) w/Device KIT, Use to check blood sugars twice daily E11.69, Disp: 1 kit, Rfl: 1   desloratadine (CLARINEX) 5 MG tablet, Take 1 tablet (5 mg total) by mouth daily., Disp: 30 tablet, Rfl: 2   diazepam (VALIUM) 5 MG tablet, Take 5 mg by mouth every 8 (eight) hours as needed for anxiety. , Disp: , Rfl:    glucose blood (ACCU-CHEK GUIDE) test strip, Use as instructed to check blood sugars twice daily E11.69, Disp: 100 each, Rfl: 2   glucose blood (ACCU-CHEK GUIDE) test strip, Use as instructed, Disp: 100 each, Rfl: 12   ketotifen (ZADITOR) 0.025 % ophthalmic solution, Place 1-2 drops into both eyes 2 (two) times daily as needed (allergies.)., Disp: , Rfl:    Lancets Misc. (ACCU-CHEK FASTCLIX LANCET) KIT, Use with accu-chek guide me, Disp: 1 kit, Rfl: 3   Magnesium 250 MG TABS, Take 1 tablet (250 mg total) by mouth daily.  Take with evening meal, Disp: 30 tablet, Rfl: 2   Semaglutide, 1 MG/DOSE, 4 MG/3ML SOPN, Inject 1 mg into the skin once a week., Disp: 9 mL, Rfl: 1   venlafaxine XR (EFFEXOR-XR) 75 MG 24 hr capsule, Take 75 mg by mouth daily., Disp: , Rfl: 12   Vitamin D, Ergocalciferol, (DRISDOL) 1.25 MG (50000 UNIT) CAPS capsule, Take 1 capsule (50,000 Units total) by mouth every 7 (seven) days., Disp: 24 capsule, Rfl: 0   Allergies  Allergen Reactions   Penicillins Rash    Unknown childhood allergy.  Has patient had a PCN reaction causing immediate rash, facial/tongue/throat swelling, SOB or lightheadedness with hypotension: No Has patient had a PCN reaction causing severe rash involving mucus membranes or skin necrosis: No Has  patient had a PCN reaction that required hospitalization: No Has patient had a PCN reaction occurring within the last 10 years: No If all of the above answers are "NO", then may proceed with Cephalosporin use.     Review of Systems  Constitutional: Negative.  Negative for fatigue.  HENT: Negative.    Eyes: Negative.   Respiratory: Negative.    Cardiovascular: Negative.   Gastrointestinal: Negative.   Endocrine: Negative.   Genitourinary: Negative.   Musculoskeletal: Negative.   Skin: Negative.   Allergic/Immunologic: Negative.   Neurological: Negative.   Hematological: Negative.   Psychiatric/Behavioral: Negative.       Today's Vitals   03/10/22 1154  BP: (!) 140/92  Pulse: 80  Temp: 97.9 F (36.6 C)  Weight: 202 lb (91.6 kg)  Height: 5' 6" (1.676 m)  PainSc: 0-No pain   Body mass index is 32.6 kg/m.  Wt Readings from Last 3 Encounters:  03/10/22 202 lb (91.6 kg)  12/15/21 230 lb (104.3 kg)  10/20/21 228 lb (103.4 kg)     Objective:  Physical Exam Vitals reviewed.  Constitutional:      General: She is not in acute distress.    Appearance: Normal appearance. She is well-developed. She is obese.  Cardiovascular:     Rate and Rhythm: Normal rate and regular rhythm.     Pulses: Normal pulses.     Heart sounds: Normal heart sounds. No murmur heard. Pulmonary:     Effort: Pulmonary effort is normal. No respiratory distress.     Breath sounds: Normal breath sounds. No wheezing.  Chest:     Chest wall: No tenderness.  Musculoskeletal:        General: Normal range of motion.  Skin:    General: Skin is warm and dry.     Capillary Refill: Capillary refill takes less than 2 seconds.  Neurological:     General: No focal deficit present.     Mental Status: She is alert and oriented to person, place, and time.     Cranial Nerves: No cranial nerve deficit.     Motor: No weakness.  Psychiatric:        Mood and Affect: Mood normal.        Behavior: Behavior normal.         Thought Content: Thought content normal.        Judgment: Judgment normal.         Assessment And Plan:     1. Diabetes mellitus type 2 in obese (HCC) Comments: Tolerating Ozempic well increased to 1 mg weekly.  - Hemoglobin A1c - Urine microalbumin-creatinine with uACR - Renal function panel with eGFR - Semaglutide, 1 MG/DOSE, 4 MG/3ML SOPN; Inject 1 mg into the   skin once a week.  Dispense: 9 mL; Refill: 1  2. Vitamin D deficiency Will check vitamin D level and supplement as needed.    Also encouraged to spend 15 minutes in the sun daily.  - VITAMIN D 25 Hydroxy (Vit-D Deficiency, Fractures)  3. Elevated blood pressure reading without diagnosis of hypertension Comments: Blood pressure elevated after repeat she has been under increased stress with her mother. She is to take Magnesium daily and send BP readings on Monday - Magnesium 250 MG TABS; Take 1 tablet (250 mg total) by mouth daily. Take with evening meal  Dispense: 30 tablet; Refill: 2  4. Morbid obesity (HCC) Comments: Congratulated on her 28 lb weight loss, continue regular exercise.  5. BMI 32.0-32.9,adult     Patient was given opportunity to ask questions. Patient verbalized understanding of the plan and was able to repeat key elements of the plan. All questions were answered to their satisfaction.  Janece Moore, FNP   I, Janece Moore, FNP, have reviewed all documentation for this visit. The documentation on 03/10/22 for the exam, diagnosis, procedures, and orders are all accurate and complete.   IF YOU HAVE BEEN REFERRED TO A SPECIALIST, IT MAY TAKE 1-2 WEEKS TO SCHEDULE/PROCESS THE REFERRAL. IF YOU HAVE NOT HEARD FROM US/SPECIALIST IN TWO WEEKS, PLEASE GIVE US A CALL AT [phone].   THE PATIENT IS ENCOURAGED TO PRACTICE SOCIAL DISTANCING DUE TO THE COVID-19 PANDEMIC.   

## 2022-03-10 NOTE — Patient Instructions (Addendum)

## 2022-03-11 LAB — RENAL FUNCTION PANEL
Albumin: 4.1 g/dL (ref 3.8–4.9)
BUN/Creatinine Ratio: 13 (ref 9–23)
BUN: 10 mg/dL (ref 6–24)
CO2: 25 mmol/L (ref 20–29)
Calcium: 9.8 mg/dL (ref 8.7–10.2)
Chloride: 99 mmol/L (ref 96–106)
Creatinine, Ser: 0.77 mg/dL (ref 0.57–1.00)
Glucose: 92 mg/dL (ref 70–99)
Phosphorus: 3 mg/dL (ref 3.0–4.3)
Potassium: 3.5 mmol/L (ref 3.5–5.2)
Sodium: 144 mmol/L (ref 134–144)
eGFR: 92 mL/min/{1.73_m2} (ref >59)

## 2022-03-11 LAB — MICROALBUMIN / CREATININE URINE RATIO
Creatinine, Urine: 778.8 mg/dL
Microalb/Creat Ratio: 28 mg/g creat (ref 0–29)
Microalbumin, Urine: 220.2 ug/mL

## 2022-03-11 LAB — HEMOGLOBIN A1C
Est. average glucose Bld gHb Est-mCnc: 131 mg/dL
Hgb A1c MFr Bld: 6.2 % — ABNORMAL HIGH (ref 4.8–5.6)

## 2022-03-11 LAB — VITAMIN D 25 HYDROXY (VIT D DEFICIENCY, FRACTURES): Vit D, 25-Hydroxy: 44.8 ng/mL (ref 30.0–100.0)

## 2022-03-15 ENCOUNTER — Other Ambulatory Visit: Payer: Self-pay | Admitting: Nurse Practitioner

## 2022-03-15 DIAGNOSIS — J302 Other seasonal allergic rhinitis: Secondary | ICD-10-CM

## 2022-03-22 ENCOUNTER — Ambulatory Visit: Payer: No Typology Code available for payment source

## 2022-06-09 ENCOUNTER — Ambulatory Visit (INDEPENDENT_AMBULATORY_CARE_PROVIDER_SITE_OTHER): Payer: No Typology Code available for payment source | Admitting: Nurse Practitioner

## 2022-06-09 ENCOUNTER — Encounter: Payer: Self-pay | Admitting: Nurse Practitioner

## 2022-06-09 VITALS — BP 110/86 | HR 76 | Temp 97.7°F | Ht 66.0 in | Wt 189.0 lb

## 2022-06-09 DIAGNOSIS — E6609 Other obesity due to excess calories: Secondary | ICD-10-CM

## 2022-06-09 DIAGNOSIS — Z6830 Body mass index (BMI) 30.0-30.9, adult: Secondary | ICD-10-CM

## 2022-06-09 DIAGNOSIS — R21 Rash and other nonspecific skin eruption: Secondary | ICD-10-CM

## 2022-06-09 MED ORDER — NYSTATIN 100000 UNIT/GM EX POWD: 1.0000 | Freq: Three times a day (TID) | CUTANEOUS | 5 refills | Status: AC

## 2022-06-09 NOTE — Patient Instructions (Signed)
Rash, Adult A rash is a change in the color of your skin. A rash can also change the way your skin feels. There are many different conditions and factors that can cause a rash. Some rashes may disappear after a few days, but some may last for a few weeks. Common causes of rashes include: Viral infections, such as: Colds. Measles. Hand, foot, and mouth disease. Bacterial infections, such as: Scarlet fever. Impetigo. Fungal infections, such as Candida. Allergic reactions to food, medicines, or skin care products. Follow these instructions at home: The goal of treatment is to stop the itching and keep the rash from spreading. Pay attention to any changes in your symptoms. Follow these instructions to help with your condition: Medicine Take or apply over-the-counter and prescription medicines only as told by your health care provider. These may include: Corticosteroid creams to treat red or swollen skin. Anti-itch lotions. Oral allergy medicines (antihistamines). Oral corticosteroids for severe symptoms.  Skin care Apply cool compresses to the affected areas. Do not scratch or rub your skin. Avoid covering the rash. Make sure the rash is exposed to air as much as possible. Managing itching and discomfort Avoid hot showers or baths, which can make itching worse. A cold shower may help. Try taking a bath with: Epsom salts. Follow manufacturer instructions on the packaging. You can get these at your local pharmacy or grocery store. Baking soda. Pour a small amount into the bath as told by your health care provider. Colloidal oatmeal. Follow manufacturer instructions on the packaging. You can get this at your local pharmacy or grocery store. Try applying baking soda paste to your skin. Stir water into baking soda until it reaches a paste-like consistency. Try applying calamine lotion. This is an over-the-counter lotion that helps to relieve itchiness. Keep cool and out of the sun. Sweating  and being hot can make itching worse. General instructions  Rest as needed. Drink enough fluid to keep your urine pale yellow. Wear loose-fitting clothing. Avoid scented soaps, detergents, and perfumes. Use gentle soaps, detergents, perfumes, and other cosmetic products. Avoid any substance that causes your rash. Keep a journal to help track what causes your rash. Write down: What you eat. What cosmetic products you use. What you drink. What you wear. This includes jewelry. Keep all follow-up visits as told by your health care provider. This is important. Contact a health care provider if: You sweat at night. You lose weight. You urinate more than normal. You urinate less than normal, or you notice that your urine is a darker color than usual. You feel weak. You vomit. Your skin or the whites of your eyes look yellow (jaundice). Your skin: Tingles. Is numb. Your rash: Does not go away after several days. Gets worse. You are: Unusually thirsty. More tired than normal. You have: New symptoms. Pain in your abdomen. A fever. Diarrhea. Get help right away if you: Have a fever and your symptoms suddenly get worse. Develop confusion. Have a severe headache or a stiff neck. Have severe joint pains or stiffness. Have a seizure. Develop a rash that covers all or most of your body. The rash may or may not be painful. Develop blisters that: Are on top of the rash. Grow larger or grow together. Are painful. Are inside your nose or mouth. Develop a rash that: Looks like purple pinprick-sized spots all over your body. Has a "bull's eye" or looks like a target. Is not related to sun exposure, is red and painful, and causes   your skin to peel. Summary A rash is a change in the color of your skin. Some rashes disappear after a few days, but some may last for a few weeks. The goal of treatment is to stop the itching and keep the rash from spreading. Take or apply over-the-counter  and prescription medicines only as told by your health care provider. Contact a health care provider if you have new or worsening symptoms. Keep all follow-up visits as told by your health care provider. This is important. This information is not intended to replace advice given to you by your health care provider. Make sure you discuss any questions you have with your health care provider. Document Revised: 05/27/2021 Document Reviewed: 05/27/2021 Elsevier Patient Education  2023 Elsevier Inc.  

## 2022-06-09 NOTE — Progress Notes (Signed)
I,Tianna Badgett,acting as a Education administrator for [employer], FNP.,have documented all relevant documentation on the behalf of Minette Brine, FNP,as directed by  Minette Brine, FNP while in the presence of Minette Brine, [place].  Subjective:     Patient ID: Rachael White , female    DOB: 1966-06-20 , 56 y.o.   MRN: [id number]   Chief Complaint  Patient presents with   Rash    HPI  Patient presents today for rash in her skin folds while she is losing weight. She will have redness and cracking to her skin. When she exercises it is worse. She has tried creams without relief. Today is not as bad. Skin is sensitive. She will have to take tissue to fold under her stomach.   Wt Readings from Last 3 Encounters: 06/09/22 : 189 lb (85.7 kg) 03/10/22 : 202 lb (91.6 kg) 12/15/21 : 230 lb (104.3 kg)  She had lap band in 2007. She is now on Ozempic.     Past Medical History:  Diagnosis Date   Allergy    Anemia    Anxiety      Family History  Problem Relation Age of Onset   Diabetes Mother    Heart disease Mother    Cancer Father        prostate   Diabetes Brother    Pancreatic cancer Maternal Aunt    Diabetes Maternal Grandmother    Colon polyps Neg Hx    Colon cancer Neg Hx      Current Outpatient Medications:    aluminum & magnesium hydroxide-simethicone (MYLANTA) 500-450-40 MG/5ML suspension, Take 15 mLs by mouth every 6 (six) hours as needed. For stomach, Disp: , Rfl:    amphetamine-dextroamphetamine (ADDERALL) 30 MG tablet, TAKE 1 TABLET BY MOUTH IN THE MORNING AND 1 TAB AT NOON, Disp: , Rfl: 0   desloratadine (CLARINEX) 5 MG tablet, TAKE 1 TABLET (5 MG TOTAL) BY MOUTH DAILY., Disp: 90 tablet, Rfl: 1   diazepam (VALIUM) 5 MG tablet, Take 5 mg by mouth every 8 (eight) hours as needed for anxiety. , Disp: , Rfl:    ketotifen (ZADITOR) 0.025 % ophthalmic solution, Place 1-2 drops into both eyes 2 (two) times daily as needed (allergies.)., Disp: , Rfl:    Magnesium 250 MG TABS, Take 1  tablet (250 mg total) by mouth daily. Take with evening meal, Disp: 30 tablet, Rfl: 2   nystatin powder, Apply 1 Application topically 3 (three) times daily., Disp: 60 g, Rfl: 5   Semaglutide, 1 MG/DOSE, 4 MG/3ML SOPN, Inject 1 mg into the skin once a week., Disp: 9 mL, Rfl: 1   venlafaxine XR (EFFEXOR-XR) 75 MG 24 hr capsule, Take 75 mg by mouth daily., Disp: , Rfl: 12   Accu-Chek Softclix Lancets lancets, Use as instructed to check blood sugars twice daily E11.69, Disp: 100 each, Rfl: 2   Blood Glucose Monitoring Suppl (ACCU-CHEK AVIVA PLUS) w/Device KIT, Use to check blood sugar twice a day., Disp: 1 kit, Rfl: 3   Blood Glucose Monitoring Suppl (ACCU-CHEK GUIDE ME) w/Device KIT, Use to check blood sugars twice daily E11.69, Disp: 1 kit, Rfl: 1   glucose blood (ACCU-CHEK GUIDE) test strip, Use as instructed to check blood sugars twice daily E11.69, Disp: 100 each, Rfl: 2   glucose blood (ACCU-CHEK GUIDE) test strip, Use as instructed, Disp: 100 each, Rfl: 12   Lancets Misc. (ACCU-CHEK FASTCLIX LANCET) KIT, Use with accu-chek guide me, Disp: 1 kit, Rfl: 3   Vitamin D,  Ergocalciferol, (DRISDOL) 1.25 MG (50000 UNIT) CAPS capsule, Take 1 capsule (50,000 Units total) by mouth every 7 (seven) days. (Patient not taking: Reported on 06/09/2022), Disp: 24 capsule, Rfl: 0   Allergies  Allergen Reactions   Penicillins Rash    Unknown childhood allergy.  Has patient had a PCN reaction causing immediate rash, facial/tongue/throat swelling, SOB or lightheadedness with hypotension: No Has patient had a PCN reaction causing severe rash involving mucus membranes or skin necrosis: No Has patient had a PCN reaction that required hospitalization: No Has patient had a PCN reaction occurring within the last 10 years: No If all of the above answers are "NO", then may proceed with Cephalosporin use.     Review of Systems  Constitutional: Negative.   Respiratory: Negative.    Cardiovascular: Negative.   Skin:   Positive for rash.  Neurological: Negative.   Psychiatric/Behavioral: Negative.       Today's Vitals   06/09/22 1058  BP: 110/86  Pulse: 76  Temp: 97.7 F (36.5 C)  TempSrc: Oral  Weight: 189 lb (85.7 kg)  Height: $Remove'5\' 6"'[id number]$  (1.676 m)   Body mass index is 30.51 kg/m.   Objective:  Physical Exam Vitals reviewed.  Cardiovascular:     Pulses: Normal pulses.     Heart sounds: Normal heart sounds. No murmur heard. Pulmonary:     Effort: Pulmonary effort is normal. No respiratory distress.     Breath sounds: Normal breath sounds. No wheezing.  Skin:    General: Skin is warm and dry.     Findings: No rash.  Neurological:     General: No focal deficit present.     Mental Status: She is oriented to person, place, and time.         Assessment And Plan:     1. Rash and nonspecific skin eruption Comments: She will set up an appt with Dr. Hassell Done who may do Panulectomies. This may be related to excess skin.  - nystatin powder; Apply 1 Application topically 3 (three) times daily.  Dispense: 60 g; Refill: 5  2. Class 1 obesity due to excess calories with serious comorbidity and body mass index (BMI) of 30.0 to 30.9 in adult Comments: She is doing well with her weight loss. She does have extra skin to her abdomen and breast tissue.     Patient was given opportunity to ask questions. Patient verbalized understanding of the plan and was able to repeat key elements of the plan. All questions were answered to their satisfaction.  Minette Brine, FNP   I, Minette Brine, FNP, have reviewed all documentation for this visit. The documentation on 06/09/22 for the exam, diagnosis, procedures, and orders are all accurate and complete.   IF YOU HAVE BEEN REFERRED TO A SPECIALIST, IT MAY TAKE 1-2 WEEKS TO SCHEDULE/PROCESS THE REFERRAL. IF YOU HAVE NOT HEARD FROM US/SPECIALIST IN TWO WEEKS, PLEASE GIVE [place] A CALL AT [phone].   THE PATIENT IS ENCOURAGED TO PRACTICE SOCIAL DISTANCING DUE TO  THE COVID-19 PANDEMIC.

## 2022-06-16 ENCOUNTER — Other Ambulatory Visit: Payer: Self-pay

## 2022-06-16 DIAGNOSIS — E669 Obesity, unspecified: Secondary | ICD-10-CM

## 2022-06-16 MED ORDER — SEMAGLUTIDE (1 MG/DOSE) 4 MG/3ML ~~LOC~~ SOPN: 1.0000 mg | PEN_INJECTOR | SUBCUTANEOUS | 1 refills | Status: DC

## 2022-06-27 ENCOUNTER — Encounter: Payer: Self-pay | Admitting: Internal Medicine

## 2022-07-04 ENCOUNTER — Encounter (INDEPENDENT_AMBULATORY_CARE_PROVIDER_SITE_OTHER): Payer: Self-pay

## 2022-07-11 ENCOUNTER — Ambulatory Visit: Payer: No Typology Code available for payment source | Admitting: Internal Medicine

## 2022-08-15 ENCOUNTER — Ambulatory Visit: Payer: No Typology Code available for payment source | Admitting: Nurse Practitioner

## 2022-08-20 ENCOUNTER — Emergency Department (HOSPITAL_BASED_OUTPATIENT_CLINIC_OR_DEPARTMENT_OTHER)
Admission: EM | Admit: 2022-08-20 | Discharge: 2022-08-21 | Disposition: A | Discharge status: Home / Self Care | Payer: No Typology Code available for payment source | Attending: Emergency Medicine | Admitting: Emergency Medicine

## 2022-08-20 ENCOUNTER — Encounter (HOSPITAL_BASED_OUTPATIENT_CLINIC_OR_DEPARTMENT_OTHER): Payer: Self-pay

## 2022-08-20 ENCOUNTER — Emergency Department (HOSPITAL_BASED_OUTPATIENT_CLINIC_OR_DEPARTMENT_OTHER): Payer: No Typology Code available for payment source | Admitting: Radiology

## 2022-08-20 ENCOUNTER — Other Ambulatory Visit: Payer: Self-pay

## 2022-08-20 DIAGNOSIS — S60212A Contusion of left wrist, initial encounter: Secondary | ICD-10-CM | POA: Insufficient documentation

## 2022-08-20 DIAGNOSIS — S60912A Unspecified superficial injury of left wrist, initial encounter: Secondary | ICD-10-CM | POA: Diagnosis present

## 2022-08-20 DIAGNOSIS — W010XXA Fall on same level from slipping, tripping and stumbling without subsequent striking against object, initial encounter: Secondary | ICD-10-CM | POA: Insufficient documentation

## 2022-08-20 DIAGNOSIS — S8002XA Contusion of left knee, initial encounter: Secondary | ICD-10-CM | POA: Insufficient documentation

## 2022-08-20 DIAGNOSIS — S93402A Sprain of unspecified ligament of left ankle, initial encounter: Secondary | ICD-10-CM | POA: Diagnosis not present

## 2022-08-20 NOTE — ED Triage Notes (Signed)
Patient arrives POV c/o falling in "pot hole", fell on left side, and twisted left knee, and left wrist. Pt denies LOC.

## 2022-08-21 NOTE — Discharge Instructions (Signed)
Apply ice for 30 minutes at a time, 4 times a day.  Take ibuprofen or naproxen as needed for pain.  If you need additional pain relief, add acetaminophen. 

## 2022-08-21 NOTE — ED Provider Notes (Signed)
[facility] EMERGENCY DEPT Provider Note   CSN: [id number] Arrival date & time: 08/20/22  2004     History  Chief Complaint  Patient presents with   Rachael White is a 56 y.o. female.  The history is provided by the patient.  Fall  She has history of anemia and comes in following a fall.  She stepped in a pothole and fell and thinks she rolled her left ankle.  She is also complaining of pain in her left wrist and left knee.  She did hit her face but denies loss of consciousness.  She is also complaining of pain in her left knee.   Home Medications Prior to Admission medications   Medication Sig Start Date End Date Taking? Authorizing Provider  Accu-Chek Softclix Lancets lancets Use as instructed to check blood sugars twice daily E11.69 12/21/21   Minette Brine, FNP  aluminum & magnesium hydroxide-simethicone (MYLANTA) 500-450-40 MG/5ML suspension Take 15 mLs by mouth every 6 (six) hours as needed. For stomach    [provider]  amphetamine-dextroamphetamine (ADDERALL) 30 MG tablet TAKE 1 TABLET BY MOUTH IN THE MORNING AND 1 TAB AT NOON 03/27/15   [provider]  Blood Glucose Monitoring Suppl (ACCU-CHEK AVIVA PLUS) w/Device KIT Use to check blood sugar twice a day. 09/14/21   Bary Castilla, NP  Blood Glucose Monitoring Suppl (ACCU-CHEK GUIDE ME) w/Device KIT Use to check blood sugars twice daily E11.69 12/15/21   Minette Brine, FNP  desloratadine (CLARINEX) 5 MG tablet TAKE 1 TABLET (5 MG TOTAL) BY MOUTH DAILY. 03/15/22 03/15/23  Minette Brine, FNP  diazepam (VALIUM) 5 MG tablet Take 5 mg by mouth every 8 (eight) hours as needed for anxiety.  04/09/12   [provider]  glucose blood (ACCU-CHEK GUIDE) test strip Use as instructed to check blood sugars twice daily E11.69 12/15/21   Minette Brine, FNP  glucose blood (ACCU-CHEK GUIDE) test strip Use as instructed 12/21/21   Minette Brine, FNP  ketotifen (ZADITOR) 0.025 % ophthalmic  solution Place 1-2 drops into both eyes 2 (two) times daily as needed (allergies.).    [provider]  Lancets Misc. (ACCU-CHEK FASTCLIX LANCET) KIT Use with accu-chek guide me 12/21/21   Minette Brine, FNP  Magnesium 250 MG TABS Take 1 tablet (250 mg total) by mouth daily. Take with evening meal 03/10/22   Minette Brine, FNP  nystatin powder Apply 1 Application topically 3 (three) times daily. 06/09/22   Minette Brine, FNP  Semaglutide, 1 MG/DOSE, 4 MG/3ML SOPN Inject 1 mg into the skin once a week. 06/16/22   Minette Brine, FNP  venlafaxine XR (EFFEXOR-XR) 75 MG 24 hr capsule Take 75 mg by mouth daily. 01/15/17   [provider]  Vitamin D, Ergocalciferol, (DRISDOL) 1.25 MG (50000 UNIT) CAPS capsule Take 1 capsule (50,000 Units total) by mouth every 7 (seven) days. Patient not taking: Reported on 06/09/2022 09/09/21   Bary Castilla, NP      Allergies    Penicillins    Review of Systems   Review of Systems  All other systems reviewed and are negative.   Physical Exam Updated Vital Signs BP (!) 147/82   Pulse 88   Temp 98.4 F (36.9 C) (Oral)   Resp 18   Ht _0  (1.676 m)   Wt 80.7 kg   SpO2 100%   BMI 28.73 kg/m  Physical Exam Vitals and nursing note reviewed.   56 year old female, resting comfortably and  in no acute distress. Vital signs are significant for mildly elevated blood pressure. Oxygen saturation is 100%, which is normal. Head is normocephalic and atraumatic. PERRLA, EOMI. Oropharynx is clear. Neck is nontender and supple without adenopathy or JVD. Back is nontender and there is no CVA tenderness. Lungs are clear without rales, wheezes, or rhonchi. Chest is nontender. Heart has regular rate and rhythm without murmur. Abdomen is soft, flat, nontender without masses or hepatosplenomegaly and peristalsis is normoactive. Extremities: There is mild soreness to palpation rather diffusely through both shoulders but full passive range of motion is  present without significant pain.  No swelling or deformity is noted of her left knee or left ankle, full passive range of motion present, no instability. Skin is warm and dry without rash. Neurologic: Mental status is normal, cranial nerves are intact, moves all extremities equally.  ED Results / Procedures / Treatments   Labs (all labs ordered are listed, but only abnormal results are displayed) Labs Reviewed - No data to display  EKG None  Radiology DG Knee Complete 4 Views Left  Result Date: 08/20/2022 CLINICAL DATA:  Fall, left knee pain EXAM: LEFT KNEE - COMPLETE 4+ VIEW COMPARISON:  None Available. FINDINGS: Normal alignment. No acute fracture or dislocation. Mild tricompartmental degenerative arthritis, most severe within the medial compartment. No effusion. Soft tissues are unremarkable. IMPRESSION: 1. Mild tricompartmental degenerative arthritis. Electronically Signed   By: Fidela Salisbury M.D.   On: 08/20/2022 21:50   DG Ankle Complete Left  Result Date: 08/20/2022 CLINICAL DATA:  Fall.  Twisted left ankle. EXAM: LEFT ANKLE COMPLETE - 3+ VIEW COMPARISON:  None Available. FINDINGS: There is no evidence of fracture, dislocation, or joint effusion. There is no evidence of arthropathy or other focal bone abnormality. Soft tissues are unremarkable. IMPRESSION: Negative. Electronically Signed   By: Keane Police D.O.   On: 08/20/2022 21:50   DG Wrist Complete Left  Result Date: 08/20/2022 CLINICAL DATA:  Fall, left wrist pain EXAM: LEFT WRIST - COMPLETE 3+ VIEW COMPARISON:  None Available. FINDINGS: Normal alignment. No acute fracture or dislocation. Mild degenerate arthritis at the base of the thumb involving the first carpometacarpal joint. Soft tissues are unremarkable. IMPRESSION: 1. No acute fracture or dislocation. Electronically Signed   By: Fidela Salisbury M.D.   On: 08/20/2022 21:50    Procedures Procedures    Medications Ordered in ED Medications - No data to  display  ED Course/ Medical Decision Making/ A&P                           Medical Decision Making Amount and/or Complexity of Data Reviewed Radiology: ordered.   Fall without evidence of serious injury.  X-rays of left knee, left ankle, left wrist are all negative for fracture.  I have independently viewed the images, and agree with radiologist's interpretation.  Exam is not suggestive of any serious injury.  I have advised that patient to apply ice and use over-the-counter NSAIDs and acetaminophen as needed for pain.  Final Clinical Impression(s) / ED Diagnoses Final diagnoses:  Fall from slip, trip, or stumble, initial encounter  Contusion of left wrist, initial encounter  Contusion of left knee, initial encounter  Sprain of left ankle, initial encounter    Rx / DC Orders ED Discharge Orders     None         Delora Fuel, MD 69/62/95 [phone]

## 2022-08-30 ENCOUNTER — Ambulatory Visit (INDEPENDENT_AMBULATORY_CARE_PROVIDER_SITE_OTHER): Payer: No Typology Code available for payment source | Admitting: Nurse Practitioner

## 2022-08-30 ENCOUNTER — Encounter: Payer: Self-pay | Admitting: Nurse Practitioner

## 2022-08-30 VITALS — BP 120/88 | HR 87 | Temp 98.3°F | Ht 66.0 in | Wt 181.6 lb

## 2022-08-30 DIAGNOSIS — E559 Vitamin D deficiency, unspecified: Secondary | ICD-10-CM | POA: Diagnosis not present

## 2022-08-30 DIAGNOSIS — R21 Rash and other nonspecific skin eruption: Secondary | ICD-10-CM | POA: Diagnosis not present

## 2022-08-30 DIAGNOSIS — E669 Obesity, unspecified: Secondary | ICD-10-CM

## 2022-08-30 DIAGNOSIS — E1169 Type 2 diabetes mellitus with other specified complication: Secondary | ICD-10-CM | POA: Diagnosis not present

## 2022-08-30 DIAGNOSIS — Z6829 Body mass index (BMI) 29.0-29.9, adult: Secondary | ICD-10-CM

## 2022-08-30 NOTE — Patient Instructions (Signed)

## 2022-08-30 NOTE — Progress Notes (Signed)
I,Victoria T Hamilton,acting as a Education administrator for Minette Brine, FNP.,have documented all relevant documentation on the behalf of Minette Brine, FNP,as directed by  Minette Brine, FNP while in the presence of Minette Brine, [place].    Subjective:     Patient ID: Rachael White , female    DOB: 07/08/1966 , 57 y.o.   MRN: [id number]   Chief Complaint  Patient presents with   Diabetes    HPI  Patient presents for DM follow up. She is taking Ozempic tolerating well. She is exercising 4 times a week. Appetite has decreased. She is also drinking adequate amounts of water. She does not have a taste for sweets.   She reports a rash, noticed right before christmas on her right shoulder. She is using cream for rash.  She does describe having itching when initially began. She has her shingrix vaccine last year.   She had a fall in a parking lot on 12/24 injured her left wrist and hit the side of her face. Rolled her left ankle. She did go to ER at that time.    Diabetes She presents for her follow-up diabetic visit. She has type 2 diabetes mellitus. There are no hypoglycemic associated symptoms. There are no diabetic associated symptoms. Pertinent negatives for diabetes include no fatigue. Diabetic meal planning: regular - she is trying to decrease her intake of sodas and salt. And increase protein. She participates in exercise three times a week. (Has not been taking her blood sugar - will pick up glucometer from [place]) She does not see a podiatrist.Eye exam is not current.     Past Medical History:  Diagnosis Date   Allergy    Anemia    Anxiety      Family History  Problem Relation Age of Onset   Diabetes Mother    Heart disease Mother    Cancer Father        prostate   Diabetes Brother    Pancreatic cancer Maternal Aunt    Diabetes Maternal Grandmother    Colon polyps Neg Hx    Colon cancer Neg Hx      Current Outpatient Medications:    Accu-Chek Softclix Lancets lancets, Use  as instructed to check blood sugars twice daily E11.69, Disp: 100 each, Rfl: 2   aluminum & magnesium hydroxide-simethicone (MYLANTA) 500-450-40 MG/5ML suspension, Take 15 mLs by mouth every 6 (six) hours as needed. For stomach, Disp: , Rfl:    amphetamine-dextroamphetamine (ADDERALL) 30 MG tablet, TAKE 1 TABLET BY MOUTH IN THE MORNING AND 1 TAB AT NOON, Disp: , Rfl: 0   Blood Glucose Monitoring Suppl (ACCU-CHEK AVIVA PLUS) w/Device KIT, Use to check blood sugar twice a day., Disp: 1 kit, Rfl: 3   Blood Glucose Monitoring Suppl (ACCU-CHEK GUIDE ME) w/Device KIT, Use to check blood sugars twice daily E11.69, Disp: 1 kit, Rfl: 1   desloratadine (CLARINEX) 5 MG tablet, TAKE 1 TABLET (5 MG TOTAL) BY MOUTH DAILY., Disp: 90 tablet, Rfl: 1   diazepam (VALIUM) 5 MG tablet, Take 5 mg by mouth every 8 (eight) hours as needed for anxiety. , Disp: , Rfl:    glucose blood (ACCU-CHEK GUIDE) test strip, Use as instructed to check blood sugars twice daily E11.69, Disp: 100 each, Rfl: 2   glucose blood (ACCU-CHEK GUIDE) test strip, Use as instructed, Disp: 100 each, Rfl: 12   ketotifen (ZADITOR) 0.025 % ophthalmic solution, Place 1-2 drops into both eyes 2 (two) times daily as needed (allergies.)., Disp: ,  Rfl:    Lancets Misc. (ACCU-CHEK FASTCLIX LANCET) KIT, Use with accu-chek guide me, Disp: 1 kit, Rfl: 3   nystatin powder, Apply 1 Application topically 3 (three) times daily., Disp: 60 g, Rfl: 5   Semaglutide, 1 MG/DOSE, 4 MG/3ML SOPN, Inject 1 mg into the skin once a week., Disp: 9 mL, Rfl: 1   venlafaxine XR (EFFEXOR-XR) 75 MG 24 hr capsule, Take 75 mg by mouth daily., Disp: , Rfl: 12   Magnesium 250 MG TABS, Take 1 tablet (250 mg total) by mouth daily. Take with evening meal (Patient not taking: Reported on 08/30/2022), Disp: 30 tablet, Rfl: 2   Vitamin D, Ergocalciferol, (DRISDOL) 1.25 MG (50000 UNIT) CAPS capsule, Take 1 capsule (50,000 Units total) by mouth every 7 (seven) days. (Patient not taking: Reported on  06/09/2022), Disp: 24 capsule, Rfl: 0   Allergies  Allergen Reactions   Penicillins Rash    Unknown childhood allergy.  Has patient had a PCN reaction causing immediate rash, facial/tongue/throat swelling, SOB or lightheadedness with hypotension: No Has patient had a PCN reaction causing severe rash involving mucus membranes or skin necrosis: No Has patient had a PCN reaction that required hospitalization: No Has patient had a PCN reaction occurring within the last 10 years: No If all of the above answers are "NO", then may proceed with Cephalosporin use.     Review of Systems  Constitutional: Negative.  Negative for fatigue.  Respiratory: Negative.    Cardiovascular: Negative.   Neurological: Negative.   Psychiatric/Behavioral: Negative.       Today's Vitals   08/30/22 1235  BP: 120/88  Pulse: 87  Temp: 98.3 F (36.8 C)  SpO2: 98%  Weight: 181 lb 9.6 oz (82.4 kg)  Height: _0  (1.676 m)   Body mass index is 29.31 kg/m.  Wt Readings from Last 3 Encounters:  08/30/22 181 lb 9.6 oz (82.4 kg)  08/20/22 178 lb (80.7 kg)  06/09/22 189 lb (85.7 kg)    Objective:  Physical Exam Vitals reviewed.  Constitutional:      General: She is not in acute distress.    Appearance: Normal appearance. She is well-developed.  Cardiovascular:     Rate and Rhythm: Normal rate and regular rhythm.     Pulses: Normal pulses.     Heart sounds: Normal heart sounds. No murmur heard. Pulmonary:     Effort: Pulmonary effort is normal. No respiratory distress.     Breath sounds: Normal breath sounds. No wheezing.  Chest:     Chest wall: No tenderness.  Musculoskeletal:        General: Normal range of motion.  Skin:    General: Skin is warm and dry.     Capillary Refill: Capillary refill takes less than 2 seconds.  Neurological:     General: No focal deficit present.     Mental Status: She is alert and oriented to person, place, and time.     Cranial Nerves: No cranial nerve deficit.      Motor: No weakness.  Psychiatric:        Mood and Affect: Mood normal.        Behavior: Behavior normal.        Thought Content: Thought content normal.        Judgment: Judgment normal.         Assessment And Plan:     1. Diabetes mellitus type 2 in obese [facility]) Comments: Diabetes is well-controlled.  Continue Ozempic - CMP14+EGFR - CBC -  Hemoglobin A1c - Lipid panel  2. Vitamin D deficiency Will check vitamin D level and supplement as needed.    Also encouraged to spend 15 minutes in the sun daily.  - VITAMIN D 25 Hydroxy (Vit-D Deficiency, Fractures)  3. Rash Comments: Rash to posterior shoulder is dry and no erythema present.  May be insect bite vs shingles vs dermatitis. since healed difficult to determine.  Continue with steroid cream.    4. Body mass index (BMI) of 29.0 to 29.9 in adult Continue exercising regularly   Patient was given opportunity to ask questions. Patient verbalized understanding of the plan and was able to repeat key elements of the plan. All questions were answered to their satisfaction.  Minette Brine, FNP   I, Minette Brine, FNP, have reviewed all documentation for this visit. The documentation on 08/31/22 for the exam, diagnosis, procedures, and orders are all accurate and complete.   IF YOU HAVE BEEN REFERRED TO A SPECIALIST, IT MAY TAKE 1-2 WEEKS TO SCHEDULE/PROCESS THE REFERRAL. IF YOU HAVE NOT HEARD FROM US/SPECIALIST IN TWO WEEKS, PLEASE GIVE [place] A CALL AT [phone].   THE PATIENT IS ENCOURAGED TO PRACTICE SOCIAL DISTANCING DUE TO THE COVID-19 PANDEMIC.

## 2022-08-31 LAB — CMP14+EGFR
ALT: 12 IU/L (ref 0–32)
AST: 14 IU/L (ref 0–40)
Albumin/Globulin Ratio: 1.3 (ref 1.2–2.2)
Albumin: 4 g/dL (ref 3.8–4.9)
Alkaline Phosphatase: 77 IU/L (ref 44–121)
BUN/Creatinine Ratio: 10 (ref 9–23)
BUN: 6 mg/dL (ref 6–24)
Bilirubin Total: 0.3 mg/dL (ref 0.0–1.2)
CO2: 28 mmol/L (ref 20–29)
Calcium: 9.4 mg/dL (ref 8.7–10.2)
Chloride: 101 mmol/L (ref 96–106)
Creatinine, Ser: 0.63 mg/dL (ref 0.57–1.00)
Globulin, Total: 3 g/dL (ref 1.5–4.5)
Glucose: 109 mg/dL — ABNORMAL HIGH (ref 70–99)
Potassium: 3.7 mmol/L (ref 3.5–5.2)
Sodium: 145 mmol/L — ABNORMAL HIGH (ref 134–144)
Total Protein: 7 g/dL (ref 6.0–8.5)
eGFR: 105 mL/min/{1.73_m2} (ref >59)

## 2022-08-31 LAB — LIPID PANEL
Chol/HDL Ratio: 3.4 ratio (ref 0.0–4.4)
Cholesterol, Total: 177 mg/dL (ref 100–199)
HDL: 52 mg/dL (ref >39)
LDL Chol Calc (NIH): 108 mg/dL — ABNORMAL HIGH (ref 0–99)
Triglycerides: 92 mg/dL (ref 0–149)
VLDL Cholesterol Cal: 17 mg/dL (ref 5–40)

## 2022-08-31 LAB — HEMOGLOBIN A1C
Est. average glucose Bld gHb Est-mCnc: 120 mg/dL
Hgb A1c MFr Bld: 5.8 % — ABNORMAL HIGH (ref 4.8–5.6)

## 2022-08-31 LAB — CBC
Hematocrit: 36.9 % (ref 34.0–46.6)
Hemoglobin: 12.6 g/dL (ref 11.1–15.9)
MCH: 28.8 pg (ref 26.6–33.0)
MCHC: 34.1 g/dL (ref 31.5–35.7)
MCV: 84 fL (ref 79–97)
Platelets: 326 10*3/uL (ref 150–450)
RBC: 4.37 x10E6/uL (ref 3.77–5.28)
RDW: 12.6 % (ref 11.7–15.4)
WBC: 5.5 10*3/uL (ref 3.4–10.8)

## 2022-08-31 LAB — VITAMIN D 25 HYDROXY (VIT D DEFICIENCY, FRACTURES): Vit D, 25-Hydroxy: 20.1 ng/mL — ABNORMAL LOW (ref 30.0–100.0)

## 2022-08-31 MED ORDER — VITAMIN D (ERGOCALCIFEROL) 1.25 MG (50000 UNIT) PO CAPS: 50000.0000 [IU] | ORAL_CAPSULE | ORAL | 1 refills | Status: AC

## 2022-10-28 NOTE — Progress Notes (Signed)
Sent message, via epic in basket, requesting orders in epic from surgeon.  

## 2022-11-01 ENCOUNTER — Ambulatory Visit: Payer: Self-pay | Admitting: Surgery

## 2022-11-03 NOTE — Patient Instructions (Addendum)
SURGICAL WAITING ROOM VISITATION Patients having surgery or a procedure may have no more than 2 support people in the waiting area - these visitors may rotate in the visitor waiting room.   Due to an increase in RSV and influenza rates and associated hospitalizations, children ages 60 and under may not visit patients in [place]. If the patient needs to stay at the hospital during part of their recovery, the visitor guidelines for inpatient rooms apply.  PRE-OP VISITATION  Pre-op nurse will coordinate an appropriate time for 1 support person to accompany the patient in pre-op.  This support person may not rotate.  This visitor will be contacted when the time is appropriate for the visitor to come back in the pre-op area.  Please refer to the [facility] website for the visitor guidelines for Inpatients (after your surgery is over and you are in a regular room).  You are not required to quarantine at this time prior to your surgery. However, you must do this: Hand Hygiene often Do NOT share personal items Notify your provider if you are in close contact with someone who has COVID or you develop fever 100.4 or greater, new onset of sneezing, cough, sore throat, shortness of breath or body aches.  If you test positive for Covid or have been in contact with anyone that has tested positive in the last 10 days please notify you surgeon.    Your procedure is scheduled on:  Tuesday  November 15, 2022  Report to [facility] Main Entrance: [place] entrance where the [employer] is available.   Report to admitting at: 05:15    AM  +++++Call this number if you have any questions or problems the morning of surgery [phone]  Do not eat food after Midnight the night prior to your surgery/procedure.  After Midnight you may have the following liquids until  04:30  AM DAY OF SURGERY  Clear Liquid Diet Water Black Coffee (sugar ok, NO MILK/CREAM OR CREAMERS)  Tea (sugar ok, NO  MILK/CREAM OR CREAMERS) regular and decaf                             Plain Jell-O  with no fruit (NO RED)                                           Fruit ices (not with fruit pulp, NO RED)                                     Popsicles (NO RED)                                                                  Juice: apple, WHITE grape, WHITE cranberry Sports drinks like Gatorade or Powerade (NO RED)             FOLLOW BOWEL PREP AND ANY ADDITIONAL PRE OP INSTRUCTIONS YOU RECEIVED FROM YOUR SURGEON'S OFFICE!!!   Oral Hygiene is also important to reduce your risk of infection.  Remember - BRUSH YOUR TEETH THE MORNING OF SURGERY WITH YOUR REGULAR TOOTHPASTE  Do NOT smoke after Midnight the night before surgery.  Diabetic Medication: Ozempic:  Stop injections 7 days before your surgery. (Last injection: 11-06-2022 ) You may resume injections the week after your surgery (Sunday 11-20-2022).   Take ONLY these medicines the morning of surgery with A SIP OF WATER: Venlafaxine (Effexor), You may take Valium if needed.    If You have been diagnosed with Sleep Apnea - Bring CPAP mask and tubing day of surgery. We will provide you with a CPAP machine on the day of your surgery.                   You may not have any metal on your body including hair pins, jewelry, and body piercing  Do not wear make-up, lotions, powders, perfumes or deodorant  Do not wear nail polish including gel and S&S, artificial / acrylic nails, or any other type of covering on natural nails including finger and toenails. If you have artificial nails, gel coating, etc., that needs to be removed by a nail salon, Please have this removed prior to surgery. Not doing so may mean that your surgery could be cancelled or delayed if the Surgeon or anesthesia staff feels like they are unable to monitor you safely.   Do not shave 48 hours prior to surgery to avoid nicks in your skin which may contribute to postoperative infections.    Contacts, Hearing Aids, dentures or bridgework may not be worn into surgery. DENTURES WILL BE REMOVED PRIOR TO SURGERY PLEASE DO NOT APPLY "Poly grip" OR ADHESIVES!!!  You may bring a small overnight bag with you on the day of surgery, only pack items that are not valuable. [facility] [facility] IS NOT RESPONSIBLE   FOR VALUABLES THAT ARE LOST OR STOLEN.   Do not bring your home medications to the hospital. The Pharmacy will dispense medications listed on your medication list to you during your admission in the Hospital.  Special Instructions: Bring a copy of your healthcare power of attorney and living will documents the day of surgery, if you wish to have them scanned into your [place] Medical Records- EPIC  Please read over the following fact sheets you were given: IF YOU HAVE QUESTIONS ABOUT YOUR [place], [place] [phone].   [place] - Preparing for Surgery Before surgery, you can play an important role.  Because skin is not sterile, your skin needs to be as free of germs as possible.  You can reduce the number of germs on your skin by washing with CHG (chlorahexidine gluconate) soap before surgery.  CHG is an antiseptic cleaner which kills germs and bonds with the skin to continue killing germs even after washing. Please DO NOT use if you have an allergy to CHG or antibacterial soaps.  If your skin becomes reddened/irritated stop using the CHG and inform your nurse when you arrive at Short Stay. Do not shave (including legs and underarms) for at least 48 hours prior to the first CHG shower.  You may shave your face/neck.  Please follow these instructions carefully:  1.  Shower with CHG Soap the night before surgery and the  morning of surgery.  2.  If you choose to wash your hair, wash your hair first as usual with your normal  shampoo.  3.  After you shampoo, rinse your hair and body thoroughly to remove the shampoo.  4.  Use CHG as you would  any other liquid soap.  You can apply chg directly to the skin and wash.  Gently with a scrungie or clean washcloth.  5.  Apply the CHG Soap to your body ONLY FROM THE NECK DOWN.   Do not use on face/ open                           Wound or open sores. Avoid contact with eyes, ears mouth and genitals (private parts).                       Wash face,  Genitals (private parts) with your normal soap.             6.  Wash thoroughly, paying special attention to the area where your  surgery  will be performed.  7.  Thoroughly rinse your body with warm water from the neck down.  8.  DO NOT shower/wash with your normal soap after using and rinsing off the CHG Soap.            9.  Pat yourself dry with a clean towel.            10.  Wear clean pajamas.            11.  Place clean sheets on your bed the night of your first shower and do not  sleep with pets.  ON THE DAY OF SURGERY : Do not apply any lotions/deodorants the morning of surgery.  Please wear clean clothes to the hospital/surgery center.    FAILURE TO FOLLOW THESE INSTRUCTIONS MAY RESULT IN THE CANCELLATION OF YOUR SURGERY  PATIENT SIGNATURE_________________________________  NURSE SIGNATURE__________________________________  ________________________________________________________________________

## 2022-11-03 NOTE — Progress Notes (Addendum)
COVID Vaccine received:  []  No [x]  Yes Date of any COVID positive Test in last 90 days:  None  PCP - Glendale Chard, MD  Minette Brine FNP Cardiologist - None  Chest x-ray -  EKG - 02-26-2017 epic  will repeat at PST  Stress Test -  ECHO -  Cardiac Cath -   PCR screen: []  Ordered & Completed                      []   No Order but Needs PROFEND                      [x]   N/A for this surgery  Surgery Plan:  [x]  Ambulatory                            []  Outpatient in bed                            []  Admit  Anesthesia:    [x]  General  []  Spinal                           []   Choice []   MAC  Bowel Prep - [x]  No  []   Yes ______  Pacemaker / ICD device [x]  No []  Yes        Device order form faxed [x]  No    []   Yes      Faxed to:  Spinal Cord Stimulator:[x]  No []  Yes      (Remind patient to bring remote DOS) Other Implants:   History of Sleep Apnea? [x]  No []  Yes   CPAP used?- [x]  No []  Yes    Does the patient monitor blood sugar? []  No [x]  Yes  []  N/A  Patient has: [x]  Pre-DM   []  DM1  []   DM2 Does patient have a [employer] or Dexacom? [x]  No []  Yes   Fasting Blood Sugar Ranges-  Checks Blood Sugar _2_ times a week  GLP1 agonist- Ozempic on Sunday GLP1 instructions: Hold 7 days prior to surgery  Blood Thinner / Instructions: none Aspirin Instructions:  none  ERAS Protocol Ordered: []  No  [x]  Yes PRE-SURGERY []  ENSURE  []  G2  [x]  No Drink Ordered Patient is to be NPO after: 04:30 am  Activity level: Patient is able to climb a flight of stairs without difficulty; [x]  No CP   but would have _some SOB   Patient can perform ADLs without assistance.   Anesthesia review: DM2, anemia, anxiety, GERD  s/p lapband bariatric surgery  Patient denies shortness of breath, fever, cough and chest pain at PAT appointment.  Patient verbalized understanding and agreement to the Pre-Surgical Instructions that were given to them at this PAT appointment. Patient was also educated of the  need to review these PAT instructions again prior to her surgery.I reviewed the appropriate phone numbers to call if they have any and questions or concerns.

## 2022-11-04 ENCOUNTER — Encounter (HOSPITAL_COMMUNITY)
Admission: RE | Admit: 2022-11-04 | Discharge: 2022-11-04 | Disposition: A | Discharge status: Home / Self Care | Payer: No Typology Code available for payment source | Source: Ambulatory Visit | Attending: Surgery | Admitting: Surgery

## 2022-11-04 ENCOUNTER — Other Ambulatory Visit: Payer: Self-pay

## 2022-11-04 ENCOUNTER — Encounter (HOSPITAL_COMMUNITY): Payer: Self-pay | Admitting: *Deleted

## 2022-11-04 VITALS — BP 126/84 | HR 82 | Temp 98.6°F | Resp 16 | Ht 66.0 in | Wt 192.0 lb

## 2022-11-04 DIAGNOSIS — E119 Type 2 diabetes mellitus without complications: Secondary | ICD-10-CM | POA: Insufficient documentation

## 2022-11-04 DIAGNOSIS — Z01818 Encounter for other preprocedural examination: Secondary | ICD-10-CM | POA: Insufficient documentation

## 2022-11-04 HISTORY — DX: Gastro-esophageal reflux disease without esophagitis: K21.9

## 2022-11-04 HISTORY — DX: Prediabetes: R73.03

## 2022-11-04 LAB — BASIC METABOLIC PANEL
Anion gap: 8 (ref 5–15)
BUN: 12 mg/dL (ref 6–20)
CO2: 27 mmol/L (ref 22–32)
Calcium: 8.6 mg/dL — ABNORMAL LOW (ref 8.9–10.3)
Chloride: 103 mmol/L (ref 98–111)
Creatinine, Ser: 0.72 mg/dL (ref 0.44–1.00)
GFR, Estimated: >60 mL/min (ref >60)
Glucose, Bld: 87 mg/dL (ref 70–99)
Potassium: 3.5 mmol/L (ref 3.5–5.1)
Sodium: 138 mmol/L (ref 135–145)

## 2022-11-04 LAB — CBC
HCT: 36.7 % (ref 36.0–46.0)
Hemoglobin: 11.7 g/dL — ABNORMAL LOW (ref 12.0–15.0)
MCH: 28.7 pg (ref 26.0–34.0)
MCHC: 31.9 g/dL (ref 30.0–36.0)
MCV: 90 fL (ref 80.0–100.0)
Platelets: 278 10*3/uL (ref 150–400)
RBC: 4.08 MIL/uL (ref 3.87–5.11)
RDW: 13.8 % (ref 11.5–15.5)
WBC: 3.9 10*3/uL — ABNORMAL LOW (ref 4.0–10.5)
nRBC: 0 % (ref 0.0–0.2)

## 2022-11-04 LAB — GLUCOSE, CAPILLARY: Glucose-Capillary: 94 mg/dL (ref 70–99)

## 2022-11-05 LAB — HEMOGLOBIN A1C
Hgb A1c MFr Bld: 6.2 % — ABNORMAL HIGH (ref 4.8–5.6)
Mean Plasma Glucose: 131 mg/dL

## 2022-11-13 NOTE — H&P (Signed)
Chief Complaint:  recurrent panniculitis  History of Present Illness:  Rachael White is an 57 y.o. female who is seen today for a preop for panniculitis. Rachael White has lost weight down to around 171. She works out at [employer].. She is retiring from [employer] as of last September but is still trying to figure out if she wants to reenter the workplace in another capacity. She is raising her 3 kids and her youngest is that is finishing high school this year.  She has a lot of redundant abdominal pannus that hangs down over her genitalia on the legs. She was wearing her athletic support today as she is going to the gym and has done as good a job as possible to try to prevent the irritation and maceration that comes with this particularly during the summer. I think she would be an excellent candidate for panniculectomy because in doing so we will avoid potential infection and the maceration but also should be able to maintain her exercise and stay healthy.   Past Medical History:  Diagnosis Date   Allergy    Anemia    Anxiety    GERD (gastroesophageal reflux disease)    Pre-diabetes     Past Surgical History:  Procedure Laterality Date   BREAST REDUCTION SURGERY     COSMETIC SURGERY     EVALUATION UNDER ANESTHESIA WITH HEMORRHOIDECTOMY N/A 04/29/2015   Procedure: EXAM UNDER ANESTHESIA WITH INTERNAL HEMORRHOID BANDING AND EXTERNAL HEMORRHOIDECTOMY;  Surgeon: Johnathan Hausen, MD;  Location: [facility];  Service: General;  Laterality: N/A;   GASTRIC BANDING PORT REVISION  09/04/2012   Procedure: GASTRIC BANDING PORT REVISION;  Surgeon: Pedro Earls, MD;  Location: [facility];  Service: General;  Laterality: N/A;  Lap Band Port Revision   KNEE ARTHROSCOPY     bilaterial   LAPAROSCOPIC GASTRIC BANDING  With vagotomy July 2007   Lapband vagotomy study patient   LAPAROSCOPIC REVISION OF GASTRIC BAND N/A 05/28/2013   Procedure: LAPAROSCOPIC REVISION OF GASTRIC BAND;  Surgeon: Pedro Earls, MD;  Location: [facility];  Service: General;  Laterality: N/A;  REMOVAL OF GASTRIC BAND AND INSERTION OF NEW AP BAND with MESH   NASAL SINUS SURGERY     TONSILLECTOMY     WRIST GANGLION EXCISION      No current facility-administered medications for this encounter.   Current Outpatient Medications  Medication Sig Dispense Refill   aluminum & magnesium hydroxide-simethicone (MYLANTA) 500-450-40 MG/5ML suspension Take 15 mLs by mouth as needed for indigestion. For stomach     amphetamine-dextroamphetamine (ADDERALL) 20 MG tablet Take 30 mg by mouth 2 (two) times daily.     desloratadine (CLARINEX) 5 MG tablet TAKE 1 TABLET (5 MG TOTAL) BY MOUTH DAILY. 90 tablet 1   diazepam (VALIUM) 5 MG tablet Take 5 mg by mouth daily as needed for anxiety.     ibuprofen (ADVIL) 200 MG tablet Take 400 mg by mouth as needed for headache or moderate pain.     ketotifen (ZADITOR) 0.025 % ophthalmic solution Place 1 drop into both eyes as needed (allergies.).     MOBIC 15 MG tablet Take 15 mg by mouth daily as needed for pain.     nystatin powder Apply 1 Application topically 3 (three) times daily. (Patient taking differently: Apply 1 Application topically as needed (rash).) 60 g 5   Semaglutide, 1 MG/DOSE, 4 MG/3ML SOPN Inject 1 mg into the skin once a week. (Patient taking differently: Inject 1 mg into  the skin every Sunday.) 9 mL 1   venlafaxine XR (EFFEXOR-XR) 75 MG 24 hr capsule Take 75 mg by mouth in the morning, at noon, and at bedtime.  12   Vitamin D, Ergocalciferol, (DRISDOL) 1.25 MG (50000 UNIT) CAPS capsule Take 1 capsule (50,000 Units total) by mouth every 7 (seven) days. (Patient taking differently: Take 50,000 Units by mouth every Sunday.) 24 capsule 1   Accu-Chek Softclix Lancets lancets Use as instructed to check blood sugars twice daily E11.69 100 each 2   Blood Glucose Monitoring Suppl (ACCU-CHEK AVIVA PLUS) w/Device KIT Use to check blood sugar twice a day. 1 kit 3   Blood Glucose Monitoring Suppl (ACCU-CHEK GUIDE  ME) w/Device KIT Use to check blood sugars twice daily E11.69 1 kit 1   glucose blood (ACCU-CHEK GUIDE) test strip Use as instructed to check blood sugars twice daily E11.69 100 each 2   glucose blood (ACCU-CHEK GUIDE) test strip Use as instructed 100 each 12   Lancets Misc. (ACCU-CHEK FASTCLIX LANCET) KIT Use with accu-chek guide me 1 kit 3   Penicillins Family History  Problem Relation Age of Onset   Diabetes Mother    Heart disease Mother    Cancer Father        prostate   Diabetes Brother    Pancreatic cancer Maternal Aunt    Diabetes Maternal Grandmother    Colon polyps Neg Hx    Colon cancer Neg Hx    Social History:   reports that she has never smoked. She has never used smokeless tobacco. She reports current alcohol use of about 1.0 - 3.0 standard drink of alcohol per week. She reports that she does not use drugs.   REVIEW OF SYSTEMS : Negative except for see problem list  Physical Exam:   Last menstrual period 03/29/2015. There is no height or weight on file to calculate BMI.  Gen:  WDWN AAF NAD  Neurological: Alert and oriented to person, place, and time. Motor and sensory function is grossly intact  Head: Normocephalic and atraumatic.  Eyes: Conjunctivae are normal. Pupils are equal, round, and reactive to light. No scleral icterus.  Neck: Normal range of motion. Neck supple. No tracheal deviation or thyromegaly present.  Cardiovascular:  SR without murmurs or gallops.  No carotid bruits Breast:  not examined Respiratory: Effort normal.  No respiratory distress. No chest wall tenderness. Breath sounds normal.  No wheezes, rales or rhonchi.  Abdomen:  pannus marked in the preop area with husband  GU:  see above Musculoskeletal: Normal range of motion. Extremities are nontender. No cyanosis, edema or clubbing noted Lymphadenopathy: No cervical, preauricular, postauricular or axillary adenopathy is present Skin: Skin is warm and dry. No rash noted. No diaphoresis. No  erythema. No pallor. Pscyh: Normal mood and affect. Behavior is normal. Judgment and thought content normal.   LABORATORY RESULTS: No results found for this or any previous visit (from the past 48 hour(s)).   RADIOLOGY RESULTS: No results found.  Problem List: Patient Active Problem List   Diagnosis Date Noted   Fitting and adjustment of gastric lap band 07/23/2013   LapbandVagotomy2007/convert to APS Lapband Sept 2014 06/13/2012    Assessment & Plan: Recurrent panniculitis for panniculectomy    Matt B. Hassell Done, MD, [facility] [phone] beeper [phone]  11/13/2022 9:34 AM

## 2022-11-14 ENCOUNTER — Encounter (HOSPITAL_COMMUNITY): Payer: Self-pay | Admitting: Surgery

## 2022-11-14 NOTE — Anesthesia Preprocedure Evaluation (Signed)
Anesthesia Evaluation  Patient identified by MRN, date of birth, ID band Patient awake    Reviewed: Allergy & Precautions, NPO status , Patient's Chart, lab work & pertinent test results  Airway Mallampati: III  TM Distance: >3 FB Neck ROM: Full    Dental  (+) Teeth Intact, Dental Advisory Given   Pulmonary neg pulmonary ROS   breath sounds clear to auscultation       Cardiovascular  Rhythm:Regular Rate:Normal     Neuro/Psych   Anxiety     negative neurological ROS     GI/Hepatic Neg liver ROS,GERD  ,,  Endo/Other  diabetes    Renal/GU negative Renal ROS     Musculoskeletal negative musculoskeletal ROS (+)    Abdominal   Peds  Hematology negative hematology ROS (+)   Anesthesia Other Findings   Reproductive/Obstetrics                             Anesthesia Physical Anesthesia Plan  ASA: 2  Anesthesia Plan: General   Post-op Pain Management: Tylenol PO (pre-op)*   Induction: Intravenous  PONV Risk Score and Plan: 4 or greater and Ondansetron, Dexamethasone, Midazolam and Scopolamine patch - Pre-op  Airway Management Planned: Oral ETT  Additional Equipment: None  Intra-op Plan:   Post-operative Plan: Extubation in OR  Informed Consent: I have reviewed the patients History and Physical, chart, labs and discussed the procedure including the risks, benefits and alternatives for the proposed anesthesia with the patient or authorized representative who has indicated his/her understanding and acceptance.     Dental advisory given  Plan Discussed with: CRNA  Anesthesia Plan Comments:        Anesthesia Quick Evaluation

## 2022-11-15 ENCOUNTER — Encounter (HOSPITAL_COMMUNITY)
Admission: RE | Disposition: A | Discharge status: Home / Self Care | Payer: Self-pay | Source: Ambulatory Visit | Attending: Surgery

## 2022-11-15 ENCOUNTER — Other Ambulatory Visit: Payer: Self-pay

## 2022-11-15 ENCOUNTER — Encounter (HOSPITAL_COMMUNITY): Payer: Self-pay | Admitting: Surgery

## 2022-11-15 ENCOUNTER — Observation Stay (HOSPITAL_COMMUNITY)
Admission: RE | Admit: 2022-11-15 | Discharge: 2022-11-17 | Disposition: A | Discharge status: Home / Self Care | Payer: No Typology Code available for payment source | Source: Ambulatory Visit | Attending: Surgery | Admitting: Surgery

## 2022-11-15 ENCOUNTER — Ambulatory Visit (HOSPITAL_COMMUNITY): Payer: No Typology Code available for payment source | Admitting: Certified Registered Nurse Anesthetist

## 2022-11-15 ENCOUNTER — Ambulatory Visit (HOSPITAL_BASED_OUTPATIENT_CLINIC_OR_DEPARTMENT_OTHER): Payer: No Typology Code available for payment source | Admitting: Certified Registered Nurse Anesthetist

## 2022-11-15 DIAGNOSIS — M793 Panniculitis, unspecified: Secondary | ICD-10-CM | POA: Diagnosis not present

## 2022-11-15 DIAGNOSIS — Z01818 Encounter for other preprocedural examination: Secondary | ICD-10-CM

## 2022-11-15 DIAGNOSIS — Z9884 Bariatric surgery status: Secondary | ICD-10-CM

## 2022-11-15 DIAGNOSIS — Z9889 Other specified postprocedural states: Secondary | ICD-10-CM

## 2022-11-15 DIAGNOSIS — E119 Type 2 diabetes mellitus without complications: Secondary | ICD-10-CM

## 2022-11-15 HISTORY — PX: PANNICULECTOMY: SHX5360

## 2022-11-15 SURGERY — PANNICULECTOMY
Anesthesia: General

## 2022-11-15 MED ORDER — ACETAMINOPHEN 10 MG/ML IV SOLN: 1000.0000 mg | Freq: Once | INTRAVENOUS | Status: DC | PRN

## 2022-11-15 MED ORDER — DEXAMETHASONE SODIUM PHOSPHATE 10 MG/ML IJ SOLN
INTRAMUSCULAR | Status: AC
  Filled 2022-11-15: qty 1

## 2022-11-15 MED ORDER — AMPHETAMINE-DEXTROAMPHETAMINE 20 MG PO TABS: 30.0000 mg | ORAL_TABLET | Freq: Two times a day (BID) | ORAL | Status: DC

## 2022-11-15 MED ORDER — PHENYLEPHRINE HCL (PRESSORS) 10 MG/ML IV SOLN
INTRAVENOUS | Status: AC
  Filled 2022-11-15: qty 1

## 2022-11-15 MED ORDER — METOPROLOL TARTRATE 5 MG/5ML IV SOLN: 5.0000 mg | Freq: Four times a day (QID) | INTRAVENOUS | Status: DC | PRN

## 2022-11-15 MED ORDER — CHLORHEXIDINE GLUCONATE 0.12 % MT SOLN
15.0000 mL | Freq: Once | OROMUCOSAL | Status: AC
  Administered 2022-11-15: 15 mL via OROMUCOSAL

## 2022-11-15 MED ORDER — ONDANSETRON HCL 4 MG/2ML IJ SOLN
INTRAMUSCULAR | Status: DC | PRN
  Administered 2022-11-15: 4 mg via INTRAVENOUS

## 2022-11-15 MED ORDER — PANTOPRAZOLE SODIUM 40 MG IV SOLR
40.0000 mg | Freq: Every day | INTRAVENOUS | Status: DC
  Administered 2022-11-15: 40 mg via INTRAVENOUS
  Filled 2022-11-15: qty 10

## 2022-11-15 MED ORDER — FENTANYL CITRATE PF 50 MCG/ML IJ SOSY: 25.0000 ug | PREFILLED_SYRINGE | INTRAMUSCULAR | Status: DC | PRN

## 2022-11-15 MED ORDER — KETAMINE HCL 10 MG/ML IJ SOLN
INTRAMUSCULAR | Status: DC | PRN
  Administered 2022-11-15 (×2): 20 mg via INTRAVENOUS
  Administered 2022-11-15: 10 mg via INTRAVENOUS

## 2022-11-15 MED ORDER — SUGAMMADEX SODIUM 200 MG/2ML IV SOLN
INTRAVENOUS | Status: DC | PRN
  Administered 2022-11-15: 200 mg via INTRAVENOUS

## 2022-11-15 MED ORDER — FENTANYL CITRATE (PF) 100 MCG/2ML IJ SOLN
INTRAMUSCULAR | Status: DC | PRN
  Administered 2022-11-15 (×2): 50 ug via INTRAVENOUS
  Administered 2022-11-15: 100 ug via INTRAVENOUS

## 2022-11-15 MED ORDER — LIDOCAINE HCL (PF) 2 % IJ SOLN
INTRAMUSCULAR | Status: DC | PRN
  Administered 2022-11-15: 1.5 mg/kg/h via INTRADERMAL

## 2022-11-15 MED ORDER — MORPHINE SULFATE (PF) 2 MG/ML IV SOLN
1.0000 mg | INTRAVENOUS | Status: DC | PRN
  Administered 2022-11-15: 1 mg via INTRAVENOUS
  Filled 2022-11-15: qty 1

## 2022-11-15 MED ORDER — ROCURONIUM BROMIDE 50 MG/5ML IV SOSY
PREFILLED_SYRINGE | INTRAVENOUS | Status: DC | PRN
  Administered 2022-11-15: 30 mg via INTRAVENOUS
  Administered 2022-11-15: 60 mg via INTRAVENOUS

## 2022-11-15 MED ORDER — FENTANYL CITRATE (PF) 100 MCG/2ML IJ SOLN
INTRAMUSCULAR | Status: AC
  Filled 2022-11-15: qty 2

## 2022-11-15 MED ORDER — LIDOCAINE 2% (20 MG/ML) 5 ML SYRINGE
INTRAMUSCULAR | Status: DC | PRN
  Administered 2022-11-15: 40 mg via INTRAVENOUS

## 2022-11-15 MED ORDER — SCOPOLAMINE 1 MG/3DAYS TD PT72
1.0000 | MEDICATED_PATCH | TRANSDERMAL | Status: DC
  Administered 2022-11-15: 1.5 mg via TRANSDERMAL
  Filled 2022-11-15: qty 1

## 2022-11-15 MED ORDER — PHENYLEPHRINE 80 MCG/ML (10ML) SYRINGE FOR IV PUSH (FOR BLOOD PRESSURE SUPPORT)
PREFILLED_SYRINGE | INTRAVENOUS | Status: AC
  Filled 2022-11-15: qty 10

## 2022-11-15 MED ORDER — CHLORHEXIDINE GLUCONATE CLOTH 2 % EX PADS: 6.0000 | MEDICATED_PAD | Freq: Once | CUTANEOUS | Status: DC

## 2022-11-15 MED ORDER — AMISULPRIDE (ANTIEMETIC) 5 MG/2ML IV SOLN: 10.0000 mg | Freq: Once | INTRAVENOUS | Status: DC | PRN

## 2022-11-15 MED ORDER — KCL IN DEXTROSE-NACL 20-5-0.45 MEQ/L-%-% IV SOLN
INTRAVENOUS | Status: DC
  Filled 2022-11-15: qty 1000

## 2022-11-15 MED ORDER — EPHEDRINE SULFATE-NACL 50-0.9 MG/10ML-% IV SOSY
PREFILLED_SYRINGE | INTRAVENOUS | Status: DC | PRN
  Administered 2022-11-15: 10 mg via INTRAVENOUS
  Administered 2022-11-15 (×2): 5 mg via INTRAVENOUS

## 2022-11-15 MED ORDER — ONDANSETRON HCL 4 MG/2ML IJ SOLN: 4.0000 mg | Freq: Four times a day (QID) | INTRAMUSCULAR | Status: DC | PRN

## 2022-11-15 MED ORDER — LIDOCAINE HCL (PF) 2 % IJ SOLN
INTRAMUSCULAR | Status: AC
  Filled 2022-11-15: qty 15

## 2022-11-15 MED ORDER — DEXAMETHASONE SODIUM PHOSPHATE 10 MG/ML IJ SOLN
INTRAMUSCULAR | Status: DC | PRN
  Administered 2022-11-15: 4 mg via INTRAVENOUS

## 2022-11-15 MED ORDER — HEPARIN SODIUM (PORCINE) 5000 UNIT/ML IJ SOLN
5000.0000 [IU] | Freq: Once | INTRAMUSCULAR | Status: AC
  Administered 2022-11-15: 5000 [IU] via SUBCUTANEOUS
  Filled 2022-11-15: qty 1

## 2022-11-15 MED ORDER — HYDROCODONE-ACETAMINOPHEN 5-325 MG PO TABS
1.0000 | ORAL_TABLET | ORAL | Status: DC | PRN
  Administered 2022-11-15 – 2022-11-17 (×7): 2 via ORAL
  Filled 2022-11-15 (×7): qty 2

## 2022-11-15 MED ORDER — SCOPOLAMINE 1 MG/3DAYS TD PT72: 1.0000 | MEDICATED_PATCH | TRANSDERMAL | Status: DC

## 2022-11-15 MED ORDER — ROCURONIUM BROMIDE 10 MG/ML (PF) SYRINGE
PREFILLED_SYRINGE | INTRAVENOUS | Status: AC
  Filled 2022-11-15: qty 10

## 2022-11-15 MED ORDER — PHENYLEPHRINE HCL-NACL 20-0.9 MG/250ML-% IV SOLN
INTRAVENOUS | Status: DC | PRN
  Administered 2022-11-15: 35 ug/min via INTRAVENOUS

## 2022-11-15 MED ORDER — OXYCODONE HCL 5 MG/5ML PO SOLN: 5.0000 mg | Freq: Once | ORAL | Status: DC | PRN

## 2022-11-15 MED ORDER — CEFAZOLIN SODIUM-DEXTROSE 2-4 GM/100ML-% IV SOLN
2.0000 g | Freq: Three times a day (TID) | INTRAVENOUS | Status: AC
  Administered 2022-11-15: 2 g via INTRAVENOUS
  Filled 2022-11-15: qty 100

## 2022-11-15 MED ORDER — PROMETHAZINE HCL 25 MG/ML IJ SOLN: 6.2500 mg | INTRAMUSCULAR | Status: DC | PRN

## 2022-11-15 MED ORDER — PROPOFOL 10 MG/ML IV BOLUS
INTRAVENOUS | Status: AC
  Filled 2022-11-15: qty 20

## 2022-11-15 MED ORDER — ACETAMINOPHEN 325 MG PO TABS: 325.0000 mg | ORAL_TABLET | ORAL | Status: DC | PRN

## 2022-11-15 MED ORDER — ORAL CARE MOUTH RINSE: 15.0000 mL | Freq: Once | OROMUCOSAL | Status: AC

## 2022-11-15 MED ORDER — SODIUM CHLORIDE 0.9 % IV SOLN
2.0000 g | INTRAVENOUS | Status: AC
  Administered 2022-11-15: 2 g via INTRAVENOUS
  Filled 2022-11-15: qty 2

## 2022-11-15 MED ORDER — ACETAMINOPHEN 500 MG PO TABS
1000.0000 mg | ORAL_TABLET | Freq: Once | ORAL | Status: AC
  Administered 2022-11-15: 1000 mg via ORAL
  Filled 2022-11-15: qty 2

## 2022-11-15 MED ORDER — ONDANSETRON 4 MG PO TBDP: 4.0000 mg | ORAL_TABLET | Freq: Four times a day (QID) | ORAL | Status: DC | PRN

## 2022-11-15 MED ORDER — PROPOFOL 10 MG/ML IV BOLUS
INTRAVENOUS | Status: DC | PRN
  Administered 2022-11-15: 150 mg via INTRAVENOUS

## 2022-11-15 MED ORDER — MIDAZOLAM HCL 2 MG/2ML IJ SOLN
INTRAMUSCULAR | Status: AC
  Filled 2022-11-15: qty 2

## 2022-11-15 MED ORDER — BUPIVACAINE LIPOSOME 1.3 % IJ SUSP: 20.0000 mL | Freq: Once | INTRAMUSCULAR | Status: DC

## 2022-11-15 MED ORDER — OXYCODONE HCL 5 MG PO TABS: 5.0000 mg | ORAL_TABLET | Freq: Once | ORAL | Status: DC | PRN

## 2022-11-15 MED ORDER — KETAMINE HCL 50 MG/5ML IJ SOSY
PREFILLED_SYRINGE | INTRAMUSCULAR | Status: AC
  Filled 2022-11-15: qty 5

## 2022-11-15 MED ORDER — ACETAMINOPHEN 160 MG/5ML PO SOLN: 325.0000 mg | ORAL | Status: DC | PRN

## 2022-11-15 MED ORDER — DIAZEPAM 5 MG PO TABS
5.0000 mg | ORAL_TABLET | Freq: Every day | ORAL | Status: DC | PRN
  Administered 2022-11-16 (×2): 5 mg via ORAL
  Filled 2022-11-15 (×2): qty 1

## 2022-11-15 MED ORDER — PHENYLEPHRINE 80 MCG/ML (10ML) SYRINGE FOR IV PUSH (FOR BLOOD PRESSURE SUPPORT)
PREFILLED_SYRINGE | INTRAVENOUS | Status: DC | PRN
  Administered 2022-11-15 (×5): 160 ug via INTRAVENOUS

## 2022-11-15 MED ORDER — LACTATED RINGERS IV SOLN: INTRAVENOUS | Status: DC

## 2022-11-15 MED ORDER — ONDANSETRON HCL 4 MG/2ML IJ SOLN
INTRAMUSCULAR | Status: AC
  Filled 2022-11-15: qty 2

## 2022-11-15 MED ORDER — MIDAZOLAM HCL 5 MG/5ML IJ SOLN
INTRAMUSCULAR | Status: DC | PRN
  Administered 2022-11-15: 2 mg via INTRAVENOUS

## 2022-11-15 MED ORDER — KETOTIFEN FUMARATE 0.035 % OP SOLN: 1.0000 [drp] | Freq: Two times a day (BID) | OPHTHALMIC | Status: DC | PRN

## 2022-11-15 SURGICAL SUPPLY — 43 items
APL SWBSTK 6 STRL LF DISP (MISCELLANEOUS) ×1
APPLICATOR COTTON TIP 6 STRL (MISCELLANEOUS) ×1 IMPLANT
APPLICATOR COTTON TIP 6IN STRL (MISCELLANEOUS) ×1
BAG COUNTER SPONGE SURGICOUNT (BAG) IMPLANT
BAG SPNG CNTER NS LX DISP (BAG)
BINDER ABDOMINAL 12 ML 46-62 (SOFTGOODS) IMPLANT
BLADE EXTENDED COATED 6.5IN (ELECTRODE) IMPLANT
BLADE SURG SZ10 CARB STEEL (BLADE) IMPLANT
CANISTER PREVENA PLUS 150 (CANNISTER) ×1 IMPLANT
CANISTER WOUNDNEG PRESSURE 500 (CANNISTER) IMPLANT
COVER SURGICAL LIGHT HANDLE (MISCELLANEOUS) ×1 IMPLANT
DRAPE WARM FLUID 44X44 (DRAPES) ×1 IMPLANT
DRESSING PEEL AND PLAC PRVNA20 (GAUZE/BANDAGES/DRESSINGS) ×1 IMPLANT
DRESSING PREVENA PLUS CUSTOM (GAUZE/BANDAGES/DRESSINGS) IMPLANT
DRSG PEEL AND PLACE PREVENA 20 (GAUZE/BANDAGES/DRESSINGS)
DRSG PREVENA PLUS CUSTOM (GAUZE/BANDAGES/DRESSINGS) ×1
ELECT REM PT RETURN 15FT ADLT (MISCELLANEOUS) ×1 IMPLANT
GAUZE SPONGE 4X4 12PLY STRL (GAUZE/BANDAGES/DRESSINGS) ×1 IMPLANT
GLOVE BIOGEL PI IND STRL 7.0 (GLOVE) ×1 IMPLANT
GLOVE SURG LX STRL 8.0 MICRO (GLOVE) ×1 IMPLANT
GOWN SPEC L4 XLG W/TWL (GOWN DISPOSABLE) ×1 IMPLANT
GOWN STRL REUS W/ TWL XL LVL3 (GOWN DISPOSABLE) ×3 IMPLANT
GOWN STRL REUS W/TWL XL LVL3 (GOWN DISPOSABLE) ×3
HANDLE SUCTION POOLE (INSTRUMENTS) IMPLANT
KIT BASIN OR (CUSTOM PROCEDURE TRAY) ×1 IMPLANT
KIT DRSG PREVENA PLUS 7DAY 125 (MISCELLANEOUS) IMPLANT
KIT TURNOVER KIT A (KITS) IMPLANT
LIGASURE IMPACT 36 18CM CVD LR (INSTRUMENTS) IMPLANT
NS IRRIG 1000ML POUR BTL (IV SOLUTION) ×2 IMPLANT
PACK GENERAL/GYN (CUSTOM PROCEDURE TRAY) ×1 IMPLANT
PACK UNIVERSAL I (CUSTOM PROCEDURE TRAY) ×1 IMPLANT
PENCIL SMOKE EVACUATOR (MISCELLANEOUS) IMPLANT
PREVENA INCISION MGT 90 150 (MISCELLANEOUS) IMPLANT
STAPLER VISISTAT 35W (STAPLE) ×1 IMPLANT
SUCTION POOLE HANDLE (INSTRUMENTS)
SUT ETHILON 3 0 PS 1 (SUTURE) ×4 IMPLANT
SUT SILK 2 0 (SUTURE)
SUT SILK 2-0 18XBRD TIE 12 (SUTURE) ×1 IMPLANT
SUT VIC AB 3-0 SH 18 (SUTURE) ×3 IMPLANT
SUT VIC AB 4-0 SH 18 (SUTURE) IMPLANT
TOWEL OR 17X26 10 PK STRL BLUE (TOWEL DISPOSABLE) ×2 IMPLANT
TRAY FOLEY MTR SLVR 14FR STAT (SET/KITS/TRAYS/PACK) IMPLANT
TRAY FOLEY MTR SLVR 16FR STAT (SET/KITS/TRAYS/PACK) ×1 IMPLANT

## 2022-11-15 NOTE — Interval H&P Note (Signed)
History and Physical Interval Note:  11/15/2022 7:23 AM  Rachael White  has presented today for surgery, with the diagnosis of PANNICULITIS.  The various methods of treatment have been discussed with the patient and family. After consideration of risks, benefits and other options for treatment, the patient has consented to  Procedure(s): PANNICULECTOMY (N/A) as a surgical intervention.  The patient's history has been reviewed, patient examined, no change in status, stable for surgery.  I have reviewed the patient's chart and labs.  Questions were answered to the patient's satisfaction.     Pedro Earls

## 2022-11-15 NOTE — Anesthesia Postprocedure Evaluation (Signed)
Anesthesia Post Note  Patient: Rachael White  Procedure(s) Performed: PANNICULECTOMY     Patient location during evaluation: PACU Anesthesia Type: General Level of consciousness: awake and alert Pain management: pain level controlled Vital Signs Assessment: post-procedure vital signs reviewed and stable Respiratory status: spontaneous breathing, nonlabored ventilation, respiratory function stable and patient connected to nasal cannula oxygen Cardiovascular status: blood pressure returned to baseline and stable Postop Assessment: no apparent nausea or vomiting Anesthetic complications: no  No notable events documented.  Last Vitals:  Vitals:   11/15/22 1235 11/15/22 1332  BP: (!) 148/98 138/89  Pulse: 86 87  Resp: 18 16  Temp: 36.4 C 36.9 C  SpO2: 100% 95%    Last Pain:  Vitals:   11/15/22 1241  TempSrc:   PainSc: 10-Worst pain ever                 Effie Berkshire

## 2022-11-15 NOTE — Op Note (Signed)
Rachael White  Mar 12, 1966   11/15/2022    PCP:  Glendale Chard, MD   Surgeon: Kaylyn Lim, MD, FACS  Asst:  Gurney Maxin, MD, FACS  Anes:  general  Preop Dx: Recurrent panniculitis after vagotomy/lapband 2006 Postop Dx: saME  Procedure: Abdominal panniculectomy ~ 6 lbs Location Surgery: [facility] 1 Complications: none  EBL:   minimal cc  Drains: None but Provena  Description of Procedure:  The patient was taken to OR 1 .  After anesthesia was administered and the patient was prepped  with betadine and chloroprep  and a timeout was performed.  I had marked her in the holding area standing with indelible marker and then when she got on the table.  Incision was made which created a large ellipse laterally on either side carrying it down below her umbilicus and then above her mons pubis.  This carried down to the fascia with the Bovie and the LigaSure and then the panniculectomy was completed with pretty much the LigaSure.  The specimen was removed and weighed about 6 pounds.  Hemostasis was achieved.  The area was irrigated with saline.  The flaps were then closed with interrupted 3-0 Vicryl in the deep subcutaneous space and subcuticular space and then with interrupted vertical mattress sutures of 4-0 nylon.  Prevena dressing was applied along with abdominal binder and the patient was then taken to the recovery room  The patient tolerated the procedure well and was taken to the PACU in stable condition.     Matt B. Hassell Done, [place], [facility], [place]

## 2022-11-15 NOTE — Anesthesia Procedure Notes (Signed)
Procedure Name: Intubation Date/Time: 11/15/2022 7:43 AM  Performed by: Renato Shin, CRNAPre-anesthesia Checklist: Patient identified, Emergency Drugs available, Suction available and Patient being monitored Patient Re-evaluated:Patient Re-evaluated prior to induction Oxygen Delivery Method: Circle system utilized Preoxygenation: Pre-oxygenation with 100% oxygen Induction Type: IV induction Ventilation: Mask ventilation without difficulty Laryngoscope Size: Miller and 3 Grade View: Grade II Tube type: Oral Tube size: 7.0 mm Number of attempts: 1 Airway Equipment and Method: Stylet and Oral airway Placement Confirmation: ETT inserted through vocal cords under direct vision, positive ETCO2 and breath sounds checked- equal and bilateral Secured at: 21 cm Tube secured with: Tape Dental Injury: Teeth and Oropharynx as per pre-operative assessment

## 2022-11-15 NOTE — Transfer of Care (Signed)
Immediate Anesthesia Transfer of Care Note  Patient: Rachael White  Procedure(s) Performed: PANNICULECTOMY  Patient Location: PACU  Anesthesia Type:General  Level of Consciousness: awake, alert , and oriented  Airway & Oxygen Therapy: Patient Spontanous Breathing and Patient connected to face mask oxygen  Post-op Assessment: Report given to RN and Post -op Vital signs reviewed and stable  Post vital signs: Reviewed and stable  Last Vitals:  Vitals Value Taken Time  BP 135/88 11/15/22 1015  Temp    Pulse 80 11/15/22 1016  Resp 10 11/15/22 1016  SpO2 100 % 11/15/22 1016  Vitals shown include unvalidated device data.  Last Pain:  Vitals:   11/15/22 0617  TempSrc: Oral  PainSc:       Patients Stated Pain Goal: 5 ([id number])  Complications: No notable events documented.

## 2022-11-16 ENCOUNTER — Encounter (HOSPITAL_COMMUNITY): Payer: Self-pay | Admitting: Surgery

## 2022-11-16 DIAGNOSIS — M793 Panniculitis, unspecified: Secondary | ICD-10-CM | POA: Diagnosis not present

## 2022-11-16 LAB — CBC
HCT: 31.1 % — ABNORMAL LOW (ref 36.0–46.0)
Hemoglobin: 10 g/dL — ABNORMAL LOW (ref 12.0–15.0)
MCH: 28.7 pg (ref 26.0–34.0)
MCHC: 32.2 g/dL (ref 30.0–36.0)
MCV: 89.4 fL (ref 80.0–100.0)
Platelets: 242 10*3/uL (ref 150–400)
RBC: 3.48 MIL/uL — ABNORMAL LOW (ref 3.87–5.11)
RDW: 13.8 % (ref 11.5–15.5)
WBC: 8.1 10*3/uL (ref 4.0–10.5)
nRBC: 0 % (ref 0.0–0.2)

## 2022-11-16 LAB — BASIC METABOLIC PANEL
Anion gap: 7 (ref 5–15)
BUN: 8 mg/dL (ref 6–20)
CO2: 27 mmol/L (ref 22–32)
Calcium: 8.2 mg/dL — ABNORMAL LOW (ref 8.9–10.3)
Chloride: 104 mmol/L (ref 98–111)
Creatinine, Ser: 0.66 mg/dL (ref 0.44–1.00)
GFR, Estimated: >60 mL/min (ref >60)
Glucose, Bld: 102 mg/dL — ABNORMAL HIGH (ref 70–99)
Potassium: 3.4 mmol/L — ABNORMAL LOW (ref 3.5–5.1)
Sodium: 138 mmol/L (ref 135–145)

## 2022-11-16 LAB — SURGICAL PATHOLOGY

## 2022-11-16 MED ORDER — PANTOPRAZOLE SODIUM 40 MG PO TBEC
40.0000 mg | DELAYED_RELEASE_TABLET | Freq: Every day | ORAL | Status: DC
  Administered 2022-11-16: 40 mg via ORAL
  Filled 2022-11-16: qty 1

## 2022-11-16 NOTE — TOC CM/SW Note (Signed)
Transition of Care [facility]) Screening Note  Patient Details  Name: Jalexa Greninger Broaden Date of Birth: 1966/05/18  Transition of Care [facility]) CM/SW Contact:    Sherie Don, LCSW Phone Number: 11/16/2022, 8:44 AM  Transition of Care Department [facility]) has reviewed patient and no TOC needs have been identified at this time. We will continue to monitor patient advancement through interdisciplinary progression rounds. If new patient transition needs arise, please place a TOC consult.

## 2022-11-16 NOTE — Progress Notes (Signed)
Patient ID: MALAYZIA WADLINGTON, female   DOB: [birth year], 57 y.o.   MRN: [id number] [facility] Progress Note:   1 Day Post-Op   THE PLAN  Advance diet.  Mobilize more.  Not ready for discharge today  Subjective: Mental status is alert.  Complaints sore in panniculectomy site. Objective: Vital signs in last 24 hours: Temp:  [97.6 F (36.4 C)-98.4 F (36.9 C)] 97.7 F (36.5 C) (03/20 0953) Pulse Rate:  [74-90] 74 (03/20 0953) Resp:  [14-18] 16 (03/20 0953) BP: (103-148)/(65-98) 103/67 (03/20 0953) SpO2:  [95 %-100 %] 98 % (03/20 0953)  Intake/Output from previous day: 03/19 0701 - 03/20 0700 In: 3725.8 [P.O.:480; I.V.:3145.8; IV Piggyback:100] Out: 2600 [Urine:2500; Blood:100] Intake/Output this shift: No intake/output data recorded.  Physical Exam: Work of breathing is not labored.  VAC on incisions  Lab Results:  Results for orders placed or performed during the hospital encounter of 11/15/22 (from the past 48 hour(s))  Surgical pathology     Status: None   Collection Time: 11/15/22  8:55 AM  Result Value Ref Range   SURGICAL PATHOLOGY      SURGICAL PATHOLOGY CASE: [id number] PATIENT: Sydnee Cabal Surgical Pathology Report     Clinical History: Panniculitis (crm)     FINAL MICROSCOPIC DIAGNOSIS:  A. PANNICULUS, PANNICULECTOMY:      Benign fibroadipose tissue consistent with panniculus.      Negative for malignancy.   GROSS DESCRIPTION:  Specimen is received fresh, and consists of a 2907 g, 63.0 x 25.0 x 5.0 cm portion of tan-yellow adipose tissue with attached tan-brown skin. The skin surface displays faint tan striae.  Sectioning reveals grossly unremarkable cut surfaces.  Representative sections are submitted in 1 cassette.  Craig Staggers 11/15/2022)   Final Diagnosis performed by Frances Nickels, MD.   Electronically signed 11/16/2022 Technical component performed at [facility], [place] [address] [place], [place] [address].   Professional component performed at [employer]. [facility], [place] [address], [place], [place] [address].  Immunohistochemistry Technical component (i f applicable) was performed at [facility]. [address] [place], [place],  [address].   IMMUNOHISTOCHEMISTRY DISCLAIMER (if applicable): Some of these immunohistochemical stains may have been developed and the performance characteristics determine by [facility]. Some may not have been cleared or approved by the U.S. Food and Drug Administration. The FDA has determined that such clearance or approval is not necessary. This test is used for clinical purposes. It should not be regarded as investigational or for research. This laboratory is certified under the [place] (CLIA-88) as qualified to perform high complexity clinical laboratory testing.  The controls stained appropriately.   Basic metabolic panel     Status: Abnormal   Collection Time: 11/16/22  4:48 AM  Result Value Ref Range   Sodium 138 135 - 145 mmol/L   Potassium 3.4 (L) 3.5 - 5.1 mmol/L   Chloride 104 98 - 111 mmol/L   CO2 27 22 - 32 mmol/L   Glucose, Bld 102 (H) 70 - 99 mg/dL    Comment: Glucose reference range applies only to samples taken after fasting for at least 8 hours.   BUN 8 6 - 20 mg/dL   Creatinine, Ser 0.66 0.44 - 1.00 mg/dL   Calcium 8.2 (L) 8.9 - 10.3 mg/dL   GFR, Estimated >60 >60 mL/min    Comment: (NOTE) Calculated using the CKD-EPI Creatinine Equation (2021)    Anion gap 7 5 -  15    Comment: Performed at [facility], [place] [address], [place], [place] [address]  CBC     Status: Abnormal   Collection Time: 11/16/22  4:48 AM  Result Value Ref Range   WBC 8.1 4.0 - 10.5 K/uL   RBC 3.48 (L) 3.87 - 5.11 MIL/uL   Hemoglobin 10.0 (L) 12.0 - 15.0 g/dL   HCT 31.1 (L) 36.0 - 46.0 %   MCV 89.4 80.0 - 100.0 fL   MCH 28.7 26.0 - 34.0 pg   MCHC 32.2  30.0 - 36.0 g/dL   RDW 13.8 11.5 - 15.5 %   Platelets 242 150 - 400 K/uL   nRBC 0.0 0.0 - 0.2 %    Comment: Performed at [facility], [place] [address], [place], [place] [address]    Radiology/Results: No results found.  Anti-infectives: Anti-infectives (From admission, onward)    Start     Dose/Rate Route Frequency Ordered Stop   11/15/22 2000  ceFAZolin (ANCEF) IVPB 2g/100 mL premix        2 g 200 mL/hr over 30 Minutes Intravenous Every 8 hours 11/15/22 1226 11/15/22 2119   11/15/22 0600  cefoTEtan (CEFOTAN) 2 g in sodium chloride 0.9 % 100 mL IVPB        2 g 200 mL/hr over 30 Minutes Intravenous On call to O.R. 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 Hassell Done, MD, [facility] [phone] to reach the surgeon on call.    11/16/2022 10:39 AM

## 2022-11-17 DIAGNOSIS — M793 Panniculitis, unspecified: Secondary | ICD-10-CM | POA: Diagnosis not present

## 2022-11-17 MED ORDER — HYDROCODONE-ACETAMINOPHEN 5-325 MG PO TABS: 1.0000 | ORAL_TABLET | Freq: Four times a day (QID) | ORAL | 0 refills | Status: AC | PRN

## 2022-11-17 NOTE — Plan of Care (Signed)
Patient is stable for discharge, discharge instructions have been given. All questions answered, patient is discharged to home with spouse.

## 2022-11-17 NOTE — Discharge Summary (Signed)
Physician Discharge Summary  Patient ID: ARLYNN PUGLISI MRN: [id number] DOB/AGE: 1966-04-23 57 y.o.  PCP: Glendale Chard, MD  Admit date: 11/15/2022 Discharge date: 11/17/2022  Admission Diagnoses:  recurrent panniculitis  Discharge Diagnoses:  same post panniculectomy  Principal Problem:   S/P panniculectomy   Surgery:  abdominal panniculectomy  Discharged Condition: improved   Hospital Course:   had surgery.  Pain control an issue.  VAC on incision and will go home with Provena in place.  Lab OK.    Consults: none  Significant Diagnostic Studies: none    Discharge Exam: Blood pressure 120/73, pulse 78, temperature 98 F (36.7 C), temperature source Oral, resp. rate 16, height 5\' 6"  (1.676 m), weight 87.1 kg, last menstrual period 03/29/2015, SpO2 97 %. Incisional vac in place (Provena) to be connected to battery pack for discharge home.  Disposition: Discharge disposition: 01-Home or Self Care       Discharge Instructions     Call MD for:  redness, tenderness, or signs of infection (pain, swelling, redness, odor or green/yellow discharge around incision site)   Complete by: As directed    Diet Carb Modified   Complete by: As directed    Bariatric solid diet   Discharge instructions   Complete by: As directed    Leave Provena incisional vac in place until the battery depletes and suction stops.  Then remove the bandage and all.   Discharge wound care:   Complete by: As directed    Once Provena is removed (~ 6 days) you may apply Neosporin to the incision line   Increase activity slowly   Complete by: As directed       Allergies as of 11/17/2022       Reactions   Penicillins Rash   Unknown childhood allergy.  Has patient had a PCN reaction causing immediate rash, facial/tongue/throat swelling, SOB or lightheadedness with hypotension: No Has patient had a PCN reaction causing severe rash involving mucus membranes or skin necrosis: No Has patient had a  PCN reaction that required hospitalization: No Has patient had a PCN reaction occurring within the last 10 years: No If all of the above answers are "NO", then may proceed with Cephalosporin use.        Medication List     TAKE these medications    Accu-Chek Aviva Plus w/Device Kit Use to check blood sugar twice a day.   Accu-Chek Guide Me w/Device Kit Use to check blood sugars twice daily E11.69   Accu-Chek FastClix Lancet Kit Use with accu-chek guide me   Accu-Chek Guide test strip Generic drug: glucose blood Use as instructed to check blood sugars twice daily E11.69   Accu-Chek Guide test strip Generic drug: glucose blood Use as instructed   Accu-Chek Softclix Lancets lancets Use as instructed to check blood sugars twice daily E11.69   aluminum & magnesium hydroxide-simethicone 500-450-40 MG/5ML suspension Commonly known as: MYLANTA Take 15 mLs by mouth as needed for indigestion. For stomach   amphetamine-dextroamphetamine 20 MG tablet Commonly known as: ADDERALL Take 30 mg by mouth 2 (two) times daily.   desloratadine 5 MG tablet Commonly known as: CLARINEX TAKE 1 TABLET (5 MG TOTAL) BY MOUTH DAILY.   diazepam 5 MG tablet Commonly known as: VALIUM Take 5 mg by mouth daily as needed for anxiety.   HYDROcodone-acetaminophen 5-325 MG tablet Commonly known as: NORCO/VICODIN Take 1 tablet by mouth every 6 (six) hours as needed for moderate pain or severe pain.   ibuprofen 200  MG tablet Commonly known as: ADVIL Take 400 mg by mouth as needed for headache or moderate pain.   ketotifen 0.025 % ophthalmic solution Commonly known as: ZADITOR Place 1 drop into both eyes as needed (allergies.).   Mobic 15 MG tablet Generic drug: meloxicam Take 15 mg by mouth daily as needed for pain.   nystatin powder Commonly known as: nystatin Apply 1 Application topically 3 (three) times daily. What changed:  when to take this reasons to take this   Semaglutide (1  MG/DOSE) 4 MG/3ML Sopn Inject 1 mg into the skin once a week. What changed: when to take this   venlafaxine XR 75 MG 24 hr capsule Commonly known as: EFFEXOR-XR Take 75 mg by mouth in the morning, at noon, and at bedtime.   Vitamin D (Ergocalciferol) 1.25 MG (50000 UNIT) Caps capsule Commonly known as: DRISDOL Take 1 capsule (50,000 Units total) by mouth every 7 (seven) days. What changed: when to take this               Discharge Care Instructions  (From admission, onward)           Start     Ordered   11/17/22 0000  Discharge wound care:       Comments: Once Provena is removed (~ 6 days) you may apply Neosporin to the incision line   11/17/22 1132             Signed: Pedro Earls 11/17/2022, 11:33 AM

## 2022-11-21 ENCOUNTER — Telehealth: Payer: Self-pay

## 2022-11-21 NOTE — Transitions of Care (Post Inpatient/ED Visit) (Signed)
   11/21/2022  Name: Rachael White MRN: [id number] DOB: 12/22/1965  Today's TOC FU Call Status: Today's TOC FU Call Status:: Successful TOC FU Call Competed TOC FU Call Complete Date: 11/21/22  Transition Care Management Follow-up Telephone Call Date of Discharge: 11/17/22 Discharge Facility: [facility]) Type of Discharge: Inpatient Admission Primary Inpatient Discharge Diagnosis:: bariatric surgery How have you been since you were released from the hospital?: Better Any questions or concerns?: No  Items Reviewed: Did you receive and understand the discharge instructions provided?: Yes Medications obtained and verified?: Yes (Medications Reviewed) Any new allergies since your discharge?: No Dietary orders reviewed?: Yes Do you have support at home?: Yes People in Home: spouse  Home Care and Equipment/Supplies: [place] Ordered?: NA Any new equipment or medical supplies ordered?: NA  Functional Questionnaire: Do you need assistance with bathing/showering or dressing?: No Do you need assistance with meal preparation?: No Do you need assistance with eating?: No Do you have difficulty maintaining continence: No Do you need assistance with getting out of bed/getting out of a chair/moving?: No Do you have difficulty managing or taking your medications?: No  Follow up appointments reviewed: PCP Follow-up appointment confirmed?: [facility] Follow-up appointment confirmed?: No Reason Specialist Follow-Up Not Confirmed: Patient has Specialist Provider Number and will Call for Appointment Do you need transportation to your follow-up appointment?: No Do you understand care options if your condition(s) worsen?: Yes-patient verbalized understanding    [place], [place] Nurse Health Advisor Direct Dial [phone]

## 2022-11-29 ENCOUNTER — Other Ambulatory Visit: Payer: Self-pay | Admitting: Internal Medicine

## 2022-11-29 DIAGNOSIS — Z0000 Encounter for general adult medical examination without abnormal findings: Secondary | ICD-10-CM

## 2022-12-09 ENCOUNTER — Other Ambulatory Visit: Payer: Self-pay | Admitting: Nurse Practitioner

## 2022-12-09 DIAGNOSIS — J302 Other seasonal allergic rhinitis: Secondary | ICD-10-CM

## 2022-12-13 ENCOUNTER — Encounter: Payer: Self-pay | Admitting: Nurse Practitioner

## 2022-12-26 ENCOUNTER — Inpatient Hospital Stay: Admission: RE | Admit: 2022-12-26 | Payer: No Typology Code available for payment source | Source: Ambulatory Visit

## 2022-12-28 NOTE — Progress Notes (Signed)
I,Jameka J Llittleton,acting as a Neurosurgeon for [employer], NP.,have documented all relevant documentation on the behalf of PAT OHENHEN, NP,as directed by  PAT Moshe Salisbury, NP while in the presence of PAT OHENHEN, NP.   Subjective:     Patient ID: Rachael White , female    DOB: Sep 25, 1965 , 57 y.o.   MRN: [id number]   Chief Complaint  Patient presents with  . Diabetes    HPI  Patient presents for DM follow up. She is taking Ozempic tolerating well. She is exercising 4 times a week. Appetite has decreased. She is also drinking adequate amounts of water. She does not have a taste for sweets.    Wt Readings from Last 3 Encounters: 12/29/22 : 190 lb (86.2 kg) 11/15/22 : 192 lb 0.1 oz (87.1 kg) 11/04/22 : 192 lb (87.1 kg)       Diabetes She presents for her follow-up diabetic visit. She has type 2 diabetes mellitus. There are no hypoglycemic associated symptoms. There are no diabetic associated symptoms. Pertinent negatives for diabetes include no fatigue, no polydipsia, no polyphagia and no polyuria. Diabetic meal planning: regular - she is trying to decrease her intake of sodas and salt. And increase protein. She participates in exercise three times a week. (Has not been taking her blood sugar - will pick up glucometer from [facility]) She does not see a podiatrist.Eye exam is not current.     Past Medical History:  Diagnosis Date  . Allergy   . Anemia   . Anxiety   . GERD (gastroesophageal reflux disease)   . Pre-diabetes      Family History  Problem Relation Age of Onset  . Diabetes Mother   . Heart disease Mother   . Cancer Father        prostate  . Diabetes Brother   . Pancreatic cancer Maternal Aunt   . Diabetes Maternal Grandmother   . Colon polyps Neg Hx   . Colon cancer Neg Hx      Current Outpatient Medications:  .  Accu-Chek Softclix Lancets lancets, Use as instructed to check blood sugars twice daily E11.69, Disp: 100 each, Rfl: 2 .  aluminum &  magnesium hydroxide-simethicone (MYLANTA) 500-450-40 MG/5ML suspension, Take 15 mLs by mouth as needed for indigestion. For stomach, Disp: , Rfl:  .  amphetamine-dextroamphetamine (ADDERALL) 20 MG tablet, Take 30 mg by mouth 2 (two) times daily., Disp: , Rfl:  .  Blood Glucose Monitoring Suppl (ACCU-CHEK AVIVA PLUS) w/Device KIT, Use to check blood sugar twice a day., Disp: 1 kit, Rfl: 3 .  Blood Glucose Monitoring Suppl (ACCU-CHEK GUIDE ME) w/Device KIT, Use to check blood sugars twice daily E11.69, Disp: 1 kit, Rfl: 1 .  desloratadine (CLARINEX) 5 MG tablet, TAKE 1 TABLET (5 MG TOTAL) BY MOUTH DAILY., Disp: 90 tablet, Rfl: 1 .  diazepam (VALIUM) 5 MG tablet, Take 5 mg by mouth daily as needed for anxiety., Disp: , Rfl:  .  glucose blood (ACCU-CHEK GUIDE) test strip, Use as instructed to check blood sugars twice daily E11.69, Disp: 100 each, Rfl: 2 .  glucose blood (ACCU-CHEK GUIDE) test strip, Use as instructed, Disp: 100 each, Rfl: 12 .  HYDROcodone-acetaminophen (NORCO/VICODIN) 5-325 MG tablet, Take 1 tablet by mouth every 6 (six) hours as needed for moderate pain or severe pain., Disp: 20 tablet, Rfl: 0 .  ibuprofen (ADVIL) 200 MG tablet, Take 400 mg by mouth as needed for headache or moderate pain., Disp: , Rfl:  .  ketotifen (ZADITOR) 0.025 % ophthalmic solution, Place 1 drop into both eyes as needed (allergies.)., Disp: , Rfl:  .  Lancets Misc. (ACCU-CHEK FASTCLIX LANCET) KIT, Use with accu-chek guide me, Disp: 1 kit, Rfl: 3 .  MOBIC 15 MG tablet, Take 15 mg by mouth daily as needed for pain., Disp: , Rfl:  .  nystatin powder, Apply 1 Application topically 3 (three) times daily. (Patient taking differently: Apply 1 Application topically as needed (rash).), Disp: 60 g, Rfl: 5 .  Semaglutide, 1 MG/DOSE, 4 MG/3ML SOPN, Inject 1 mg into the skin once a week. (Patient taking differently: Inject 1 mg into the skin every Sunday.), Disp: 9 mL, Rfl: 1 .  venlafaxine XR (EFFEXOR-XR) 75 MG 24 hr capsule,  Take 75 mg by mouth in the morning, at noon, and at bedtime., Disp: , Rfl: 12 .  Vitamin D, Ergocalciferol, (DRISDOL) 1.25 MG (50000 UNIT) CAPS capsule, Take 1 capsule (50,000 Units total) by mouth every 7 (seven) days. (Patient taking differently: Take 50,000 Units by mouth every Sunday.), Disp: 24 capsule, Rfl: 1   Allergies  Allergen Reactions  . Penicillins Rash    Unknown childhood allergy.  Has patient had a PCN reaction causing immediate rash, facial/tongue/throat swelling, SOB or lightheadedness with hypotension: No Has patient had a PCN reaction causing severe rash involving mucus membranes or skin necrosis: No Has patient had a PCN reaction that required hospitalization: No Has patient had a PCN reaction occurring within the last 10 years: No If all of the above answers are "NO", then may proceed with Cephalosporin use.     Review of Systems  Constitutional: Negative.  Negative for fatigue.  Respiratory: Negative.    Cardiovascular: Negative.   Endocrine: Negative for polydipsia, polyphagia and polyuria.  Musculoskeletal: Negative.   Skin: Negative.   Neurological: Negative.   Psychiatric/Behavioral: Negative.       Today's Vitals   12/29/22 1229  Temp: 98.1 F (36.7 C)  Weight: 190 lb (86.2 kg)  Height: 5\' 6"  (1.676 m)  PainSc: 0-No pain   Body mass index is 30.67 kg/m.   Objective:  Physical Exam      Assessment And Plan:     1. Type 2 diabetes mellitus without complication, without long-term current use of insulin (HCC)  2. Vitamin D deficiency     Patient was given opportunity to ask questions. Patient verbalized understanding of the plan and was able to repeat key elements of the plan. All questions were answered to their satisfaction.  Patrecia Pour Llittleton, CMA   I, Patrecia Pour Llittleton, CMA, have reviewed all documentation for this visit. The documentation on 12/29/22 for the exam, diagnosis, procedures, and orders are all accurate and complete.    IF YOU HAVE BEEN REFERRED TO A SPECIALIST, IT MAY TAKE 1-2 WEEKS TO SCHEDULE/PROCESS THE REFERRAL. IF YOU HAVE NOT HEARD FROM US/SPECIALIST IN TWO WEEKS, PLEASE GIVE [place] A CALL AT [phone].   THE PATIENT IS ENCOURAGED TO PRACTICE SOCIAL DISTANCING DUE TO THE COVID-19 PANDEMIC.

## 2022-12-28 NOTE — Patient Instructions (Signed)

## 2022-12-29 ENCOUNTER — Encounter: Payer: No Typology Code available for payment source | Admitting: Family Medicine

## 2022-12-29 VITALS — Temp 98.1°F | Ht 66.0 in | Wt 190.0 lb

## 2022-12-29 DIAGNOSIS — E119 Type 2 diabetes mellitus without complications: Secondary | ICD-10-CM

## 2022-12-29 DIAGNOSIS — E559 Vitamin D deficiency, unspecified: Secondary | ICD-10-CM

## 2023-01-02 ENCOUNTER — Ambulatory Visit: Payer: No Typology Code available for payment source | Admitting: Nurse Practitioner

## 2023-01-03 ENCOUNTER — Ambulatory Visit: Payer: No Typology Code available for payment source | Admitting: Nurse Practitioner

## 2023-01-08 ENCOUNTER — Other Ambulatory Visit: Payer: Self-pay | Admitting: Nurse Practitioner

## 2023-01-10 ENCOUNTER — Ambulatory Visit: Payer: No Typology Code available for payment source | Admitting: Nurse Practitioner

## 2023-01-10 NOTE — Progress Notes (Deleted)
Hershal Coria Wilma Wuthrich,acting as a Neurosurgeon for Arnette Felts, FNP.,have documented all relevant documentation on the behalf of Arnette Felts, FNP,as directed by  Arnette Felts, FNP while in the presence of Arnette Felts, FNP.    Subjective:     Patient ID: Rachael White , female    DOB: 1965/10/02 , 57 y.o.   MRN: [id number]   No chief complaint on file.   HPI  Patient presents today for DM check, patient states compliance with medications and has no other concerns today.     Past Medical History:  Diagnosis Date  . Allergy   . Anemia   . Anxiety   . GERD (gastroesophageal reflux disease)   . Pre-diabetes      Family History  Problem Relation Age of Onset  . Diabetes Mother   . Heart disease Mother   . Cancer Father        prostate  . Diabetes Brother   . Pancreatic cancer Maternal Aunt   . Diabetes Maternal Grandmother   . Colon polyps Neg Hx   . Colon cancer Neg Hx      Current Outpatient Medications:  .  Accu-Chek Softclix Lancets lancets, Use as instructed to check blood sugars twice daily E11.69, Disp: 100 each, Rfl: 2 .  aluminum & magnesium hydroxide-simethicone (MYLANTA) 500-450-40 MG/5ML suspension, Take 15 mLs by mouth as needed for indigestion. For stomach, Disp: , Rfl:  .  amphetamine-dextroamphetamine (ADDERALL) 20 MG tablet, Take 30 mg by mouth 2 (two) times daily., Disp: , Rfl:  .  Blood Glucose Monitoring Suppl (ACCU-CHEK AVIVA PLUS) w/Device KIT, Use to check blood sugar twice a day., Disp: 1 kit, Rfl: 3 .  Blood Glucose Monitoring Suppl (ACCU-CHEK GUIDE ME) w/Device KIT, Use to check blood sugars twice daily E11.69, Disp: 1 kit, Rfl: 1 .  desloratadine (CLARINEX) 5 MG tablet, TAKE 1 TABLET (5 MG TOTAL) BY MOUTH DAILY., Disp: 90 tablet, Rfl: 1 .  diazepam (VALIUM) 5 MG tablet, Take 5 mg by mouth daily as needed for anxiety., Disp: , Rfl:  .  glucose blood (ACCU-CHEK GUIDE) test strip, Use as instructed to check blood sugars twice daily E11.69, Disp: 100 each,  Rfl: 2 .  glucose blood (ACCU-CHEK GUIDE) test strip, Use as instructed, Disp: 100 each, Rfl: 12 .  HYDROcodone-acetaminophen (NORCO/VICODIN) 5-325 MG tablet, Take 1 tablet by mouth every 6 (six) hours as needed for moderate pain or severe pain., Disp: 20 tablet, Rfl: 0 .  ibuprofen (ADVIL) 200 MG tablet, Take 400 mg by mouth as needed for headache or moderate pain., Disp: , Rfl:  .  ketotifen (ZADITOR) 0.025 % ophthalmic solution, Place 1 drop into both eyes as needed (allergies.)., Disp: , Rfl:  .  Lancets Misc. (ACCU-CHEK FASTCLIX LANCET) KIT, Use with accu-chek guide me, Disp: 1 kit, Rfl: 3 .  MOBIC 15 MG tablet, Take 15 mg by mouth daily as needed for pain., Disp: , Rfl:  .  nystatin powder, Apply 1 Application topically 3 (three) times daily. (Patient taking differently: Apply 1 Application topically as needed (rash).), Disp: 60 g, Rfl: 5 .  Semaglutide, 1 MG/DOSE, 4 MG/3ML SOPN, Inject 1 mg into the skin once a week. (Patient taking differently: Inject 1 mg into the skin every Sunday.), Disp: 9 mL, Rfl: 1 .  Semaglutide,0.25 or 0.5MG /DOS, (OZEMPIC, 0.25 OR 0.5 MG/DOSE,) 2 MG/3ML SOPN, INJECT 0.5 MG INTO THE SKIN ONCE A WEEK., Disp: 3 mL, Rfl: 5 .  venlafaxine XR (EFFEXOR-XR) 75 MG 24  hr capsule, Take 75 mg by mouth in the morning, at noon, and at bedtime., Disp: , Rfl: 12 .  Vitamin D, Ergocalciferol, (DRISDOL) 1.25 MG (50000 UNIT) CAPS capsule, Take 1 capsule (50,000 Units total) by mouth every 7 (seven) days. (Patient taking differently: Take 50,000 Units by mouth every Sunday.), Disp: 24 capsule, Rfl: 1   Allergies  Allergen Reactions  . Penicillins Rash    Unknown childhood allergy.  Has patient had a PCN reaction causing immediate rash, facial/tongue/throat swelling, SOB or lightheadedness with hypotension: No Has patient had a PCN reaction causing severe rash involving mucus membranes or skin necrosis: No Has patient had a PCN reaction that required hospitalization: No Has patient  had a PCN reaction occurring within the last 10 years: No If all of the above answers are "NO", then may proceed with Cephalosporin use.     Review of Systems   There were no vitals filed for this visit. There is no height or weight on file to calculate BMI.  The 10-year ASCVD risk score (Arnett DK, et al., 2019) is: 5.8%   Values used to calculate the score:     Age: 69 years     Sex: Female     Is Non-Hispanic African American: Yes     Diabetic: Yes     Tobacco smoker: No     Systolic Blood Pressure: 120 mmHg     Is BP treated: No     HDL Cholesterol: 52 mg/dL     Total Cholesterol: 177 mg/dL ++ Objective:  Physical Exam      Assessment And Plan:     1. Type 2 diabetes mellitus without complication, without long-term current use of insulin (HCC)    No follow-ups on file.  Patient was given opportunity to ask questions. Patient verbalized understanding of the plan and was able to repeat key elements of the plan. All questions were answered to their satisfaction.  Marlyn Corporal, CMA   I, Marlyn Corporal, CMA, have reviewed all documentation for this visit. The documentation on 01/10/23 for the exam, diagnosis, procedures, and orders are all accurate and complete.   IF YOU HAVE BEEN REFERRED TO A SPECIALIST, IT MAY TAKE 1-2 WEEKS TO SCHEDULE/PROCESS THE REFERRAL. IF YOU HAVE NOT HEARD FROM US/SPECIALIST IN TWO WEEKS, PLEASE GIVE [place] A CALL AT [phone].   THE PATIENT IS ENCOURAGED TO PRACTICE SOCIAL DISTANCING DUE TO THE COVID-19 PANDEMIC.

## 2023-02-09 ENCOUNTER — Other Ambulatory Visit: Payer: Self-pay | Admitting: Nurse Practitioner

## 2023-02-09 DIAGNOSIS — E1169 Type 2 diabetes mellitus with other specified complication: Secondary | ICD-10-CM

## 2023-02-23 ENCOUNTER — Ambulatory Visit: Payer: No Typology Code available for payment source | Admitting: Nurse Practitioner

## 2023-02-23 ENCOUNTER — Encounter: Payer: Self-pay | Admitting: Nurse Practitioner

## 2023-02-23 VITALS — BP 120/82 | HR 87 | Temp 97.9°F | Ht 66.0 in | Wt 197.4 lb

## 2023-02-23 DIAGNOSIS — Z79899 Other long term (current) drug therapy: Secondary | ICD-10-CM

## 2023-02-23 DIAGNOSIS — E1169 Type 2 diabetes mellitus with other specified complication: Secondary | ICD-10-CM

## 2023-02-23 DIAGNOSIS — Z6831 Body mass index (BMI) 31.0-31.9, adult: Secondary | ICD-10-CM

## 2023-02-23 DIAGNOSIS — E559 Vitamin D deficiency, unspecified: Secondary | ICD-10-CM

## 2023-02-23 DIAGNOSIS — Z780 Asymptomatic menopausal state: Secondary | ICD-10-CM

## 2023-02-23 DIAGNOSIS — E6609 Other obesity due to excess calories: Secondary | ICD-10-CM

## 2023-02-23 MED ORDER — MOUNJARO 2.5 MG/0.5ML ~~LOC~~ SOAJ: 2.5000 mg | SUBCUTANEOUS | 0 refills | Status: DC

## 2023-02-23 NOTE — Progress Notes (Signed)
I,Jameka J Llittleton, CMA,acting as a Neurosurgeon for [employer], FNP.,have documented all relevant documentation on the behalf of Arnette Felts, FNP,as directed by  Arnette Felts, FNP while in the presence of Arnette Felts, FNP.  Subjective:  Patient ID: Rachael White , female    DOB: 02/23/1966 , 57 y.o.   MRN: [id number]  Chief Complaint  Patient presents with   Diabetes    HPI  Patient presents today for a diabetes check. Patient reports compliance with her meds. Patient does not have any questions or concerns at this time.  Wt Readings from Last 3 Encounters: 02/23/23 : 197 lb 6.4 oz (89.5 kg) 12/29/22 : 190 lb (86.2 kg) 11/15/22 : 192 lb 0.1 oz (87.1 kg)    Diabetes She presents for her follow-up diabetic visit. She has type 2 diabetes mellitus. There are no hypoglycemic associated symptoms. There are no diabetic associated symptoms. Pertinent negatives for diabetes include no polydipsia, no polyphagia and no polyuria. She is following a generally healthy diet. Exercise: she is trying to get back in the swing of things since having surgery in March. (Reports has been consistent when checking her blood sugar but has been normal) She does not see a podiatrist.Eye exam is not current.     Past Medical History:  Diagnosis Date   Allergy    Anemia    Anxiety    GERD (gastroesophageal reflux disease)    Pre-diabetes      Family History  Problem Relation Age of Onset   Diabetes Mother    Heart disease Mother    Cancer Father        prostate   Diabetes Brother    Pancreatic cancer Maternal Aunt    Diabetes Maternal Grandmother    Colon polyps Neg Hx    Colon cancer Neg Hx      Current Outpatient Medications:    Accu-Chek Softclix Lancets lancets, Use as instructed to check blood sugars twice daily E11.69, Disp: 100 each, Rfl: 2   aluminum & magnesium hydroxide-simethicone (MYLANTA) 500-450-40 MG/5ML suspension, Take 15 mLs by mouth as needed for indigestion. For stomach,  Disp: , Rfl:    amphetamine-dextroamphetamine (ADDERALL) 20 MG tablet, Take 30 mg by mouth 2 (two) times daily., Disp: , Rfl:    Blood Glucose Monitoring Suppl (ACCU-CHEK AVIVA PLUS) w/Device KIT, Use to check blood sugar twice a day., Disp: 1 kit, Rfl: 3   Blood Glucose Monitoring Suppl (ACCU-CHEK GUIDE ME) w/Device KIT, Use to check blood sugars twice daily E11.69, Disp: 1 kit, Rfl: 1   desloratadine (CLARINEX) 5 MG tablet, TAKE 1 TABLET (5 MG TOTAL) BY MOUTH DAILY., Disp: 90 tablet, Rfl: 1   diazepam (VALIUM) 5 MG tablet, Take 5 mg by mouth daily as needed for anxiety., Disp: , Rfl:    glucose blood (ACCU-CHEK GUIDE) test strip, Use as instructed to check blood sugars twice daily E11.69, Disp: 100 each, Rfl: 2   glucose blood (ACCU-CHEK GUIDE) test strip, Use as instructed, Disp: 100 each, Rfl: 12   HYDROcodone-acetaminophen (NORCO/VICODIN) 5-325 MG tablet, Take 1 tablet by mouth every 6 (six) hours as needed for moderate pain or severe pain., Disp: 20 tablet, Rfl: 0   ibuprofen (ADVIL) 200 MG tablet, Take 400 mg by mouth as needed for headache or moderate pain., Disp: , Rfl:    ketotifen (ZADITOR) 0.025 % ophthalmic solution, Place 1 drop into both eyes as needed (allergies.)., Disp: , Rfl:    Lancets Misc. (ACCU-CHEK FASTCLIX LANCET) KIT, Use  with accu-chek guide me, Disp: 1 kit, Rfl: 3   MOBIC 15 MG tablet, Take 15 mg by mouth daily as needed for pain., Disp: , Rfl:    nystatin powder, Apply 1 Application topically 3 (three) times daily. (Patient taking differently: Apply 1 Application topically as needed (rash).), Disp: 60 g, Rfl: 5   tirzepatide (MOUNJARO) 2.5 MG/0.5ML Pen, Inject 2.5 mg into the skin once a week., Disp: 2 mL, Rfl: 0   venlafaxine XR (EFFEXOR-XR) 75 MG 24 hr capsule, Take 75 mg by mouth in the morning, at noon, and at bedtime., Disp: , Rfl: 12   Vitamin D, Ergocalciferol, (DRISDOL) 1.25 MG (50000 UNIT) CAPS capsule, Take 1 capsule (50,000 Units total) by mouth every 7 (seven)  days. (Patient taking differently: Take 50,000 Units by mouth every Sunday.), Disp: 24 capsule, Rfl: 1   Allergies  Allergen Reactions   Penicillins Rash    Unknown childhood allergy.  Has patient had a PCN reaction causing immediate rash, facial/tongue/throat swelling, SOB or lightheadedness with hypotension: No Has patient had a PCN reaction causing severe rash involving mucus membranes or skin necrosis: No Has patient had a PCN reaction that required hospitalization: No Has patient had a PCN reaction occurring within the last 10 years: No If all of the above answers are "NO", then may proceed with Cephalosporin use.     Review of Systems  Constitutional: Negative.   Eyes: Negative.   Respiratory: Negative.    Cardiovascular: Negative.   Endocrine: Negative for polydipsia, polyphagia and polyuria.  Musculoskeletal: Negative.   Skin: Negative.   Neurological: Negative.   Psychiatric/Behavioral: Negative.       Today's Vitals   02/23/23 1024  BP: 120/82  Pulse: 87  Temp: 97.9 F (36.6 C)  Weight: 197 lb 6.4 oz (89.5 kg)  Height: 5\' 6"  (1.676 m)  PainSc: 0-No pain   Body mass index is 31.86 kg/m.  Wt Readings from Last 3 Encounters:  02/23/23 197 lb 6.4 oz (89.5 kg)  12/29/22 190 lb (86.2 kg)  11/15/22 192 lb 0.1 oz (87.1 kg)     Objective:  Physical Exam Vitals reviewed.  Constitutional:      General: She is not in acute distress.    Appearance: Normal appearance. She is well-developed. She is obese.  Cardiovascular:     Rate and Rhythm: Normal rate and regular rhythm.     Pulses: Normal pulses.     Heart sounds: Normal heart sounds. No murmur heard. Pulmonary:     Effort: Pulmonary effort is normal. No respiratory distress.     Breath sounds: Normal breath sounds. No wheezing.  Chest:     Chest wall: No tenderness.  Musculoskeletal:        General: Normal range of motion.  Skin:    General: Skin is warm and dry.     Capillary Refill: Capillary refill  takes less than 2 seconds.     Coloration: Skin is not jaundiced.     Findings: No bruising.  Neurological:     General: No focal deficit present.     Mental Status: She is alert and oriented to person, place, and time.     Cranial Nerves: No cranial nerve deficit.     Motor: No weakness.  Psychiatric:        Mood and Affect: Mood normal.        Behavior: Behavior normal.        Thought Content: Thought content normal.  Judgment: Judgment normal.         Assessment And Plan:  Type 2 diabetes mellitus with obesity (HCC) Assessment & Plan: Has been taking Ozempic but will change to Mounjaro 2.5 mg weekly. Will see if insurance will cover  Orders: -     Hemoglobin A1c -     Basic metabolic panel -     Microalbumin / creatinine urine ratio -     Mounjaro; Inject 2.5 mg into the skin once a week.  Dispense: 2 mL; Refill: 0  Vitamin D deficiency Assessment & Plan: Will check vitamin D level and supplement as needed.    Also encouraged to spend 15 minutes in the sun daily.     Menopause Assessment & Plan: She will try OTC supplements such as black cohosh or estreven, if not effective after taking for at least 30 - 60 days will consider Rx medication may consider Veozah she is already taking effexor.    Class 1 obesity due to excess calories with body mass index (BMI) of 31.0 to 31.9 in adult, unspecified whether serious comorbidity present Assessment & Plan: She is encouraged to strive for BMI less than 30 to decrease cardiac risk. Advised to aim for at least 150 minutes of exercise per week. Weight has increased slightly this may be related to being menopausal. Unfortunately she has gained approximately 7 lbs since her last office visit.    Other long term (current) drug therapy -     CBC     Return for DM check-4 months; schedule HM when needed.  Patient was given opportunity to ask questions. Patient verbalized understanding of the plan and was able to repeat  key elements of the plan. All questions were answered to their satisfaction.   Arnette Felts, FNP  I, Arnette Felts, FNP, have reviewed all documentation for this visit. The documentation on 02/23/23 for the exam, diagnosis, procedures, and orders are all accurate and complete.   IF YOU HAVE BEEN REFERRED TO A SPECIALIST, IT MAY TAKE 1-2 WEEKS TO SCHEDULE/PROCESS THE REFERRAL. IF YOU HAVE NOT HEARD FROM US/SPECIALIST IN TWO WEEKS, PLEASE GIVE [place] A CALL AT [phone] X [phone].

## 2023-02-23 NOTE — Patient Instructions (Signed)

## 2023-02-24 LAB — HEMOGLOBIN A1C
Est. average glucose Bld gHb Est-mCnc: 128 mg/dL
Hgb A1c MFr Bld: 6.1 % — ABNORMAL HIGH (ref 4.8–5.6)

## 2023-02-24 LAB — CBC
Hematocrit: 36.1 % (ref 34.0–46.6)
Hemoglobin: 11.9 g/dL (ref 11.1–15.9)
MCH: 26.3 pg — ABNORMAL LOW (ref 26.6–33.0)
MCHC: 33 g/dL (ref 31.5–35.7)
MCV: 80 fL (ref 79–97)
Platelets: 325 10*3/uL (ref 150–450)
RBC: 4.52 x10E6/uL (ref 3.77–5.28)
RDW: 13.3 % (ref 11.7–15.4)
WBC: 4.8 10*3/uL (ref 3.4–10.8)

## 2023-02-24 LAB — BASIC METABOLIC PANEL
BUN/Creatinine Ratio: 13 (ref 9–23)
BUN: 10 mg/dL (ref 6–24)
CO2: 27 mmol/L (ref 20–29)
Calcium: 9.6 mg/dL (ref 8.7–10.2)
Chloride: 100 mmol/L (ref 96–106)
Creatinine, Ser: 0.8 mg/dL (ref 0.57–1.00)
Glucose: 69 mg/dL — ABNORMAL LOW (ref 70–99)
Potassium: 3.6 mmol/L (ref 3.5–5.2)
Sodium: 142 mmol/L (ref 134–144)
eGFR: 87 mL/min/{1.73_m2} (ref >59)

## 2023-02-24 LAB — MICROALBUMIN / CREATININE URINE RATIO
Creatinine, Urine: 350.6 mg/dL
Microalb/Creat Ratio: 7 mg/g creat (ref 0–29)
Microalbumin, Urine: 23.2 ug/mL

## 2023-03-14 MED ORDER — GVOKE HYPOPEN 2-PACK 0.5 MG/0.1ML ~~LOC~~ SOAJ: 0.1000 mL | SUBCUTANEOUS | 2 refills | Status: AC | PRN

## 2023-03-14 NOTE — Assessment & Plan Note (Addendum)
She will try OTC supplements such as black cohosh or estreven, if not effective after taking for at least 30 - 60 days will consider Rx medication may consider Veozah she is already taking effexor.

## 2023-03-14 NOTE — Assessment & Plan Note (Addendum)
She is encouraged to strive for BMI less than 30 to decrease cardiac risk. Advised to aim for at least 150 minutes of exercise per week. Weight has increased slightly this may be related to being menopausal. Unfortunately she has gained approximately 7 lbs since her last office visit.

## 2023-03-14 NOTE — Assessment & Plan Note (Signed)
Will check vitamin D level and supplement as needed.    Also encouraged to spend 15 minutes in the sun daily.   

## 2023-03-14 NOTE — Assessment & Plan Note (Signed)
Has been taking Ozempic but will change to Mounjaro 2.5 mg weekly. Will see if insurance will cover

## 2023-04-23 ENCOUNTER — Other Ambulatory Visit: Payer: Self-pay | Admitting: Nurse Practitioner

## 2023-04-23 DIAGNOSIS — E1169 Type 2 diabetes mellitus with other specified complication: Secondary | ICD-10-CM

## 2023-04-25 ENCOUNTER — Ambulatory Visit
Admission: RE | Admit: 2023-04-25 | Discharge: 2023-04-25 | Disposition: A | Discharge status: Home / Self Care | Payer: No Typology Code available for payment source | Source: Ambulatory Visit | Attending: Internal Medicine | Admitting: Internal Medicine

## 2023-04-25 DIAGNOSIS — Z0000 Encounter for general adult medical examination without abnormal findings: Secondary | ICD-10-CM

## 2023-06-26 ENCOUNTER — Ambulatory Visit: Payer: No Typology Code available for payment source | Admitting: Nurse Practitioner

## 2023-07-11 ENCOUNTER — Encounter: Payer: Self-pay | Admitting: Nurse Practitioner

## 2023-07-11 ENCOUNTER — Ambulatory Visit (INDEPENDENT_AMBULATORY_CARE_PROVIDER_SITE_OTHER): Payer: No Typology Code available for payment source | Admitting: Nurse Practitioner

## 2023-07-11 VITALS — BP 120/80 | HR 79 | Temp 98.5°F | Ht 66.0 in | Wt 196.2 lb

## 2023-07-11 DIAGNOSIS — Z6831 Body mass index (BMI) 31.0-31.9, adult: Secondary | ICD-10-CM | POA: Diagnosis not present

## 2023-07-11 DIAGNOSIS — E119 Type 2 diabetes mellitus without complications: Secondary | ICD-10-CM

## 2023-07-11 DIAGNOSIS — E1169 Type 2 diabetes mellitus with other specified complication: Secondary | ICD-10-CM | POA: Diagnosis not present

## 2023-07-11 DIAGNOSIS — E6609 Other obesity due to excess calories: Secondary | ICD-10-CM | POA: Diagnosis not present

## 2023-07-11 DIAGNOSIS — E66811 Obesity, class 1: Secondary | ICD-10-CM

## 2023-07-11 DIAGNOSIS — Z2821 Immunization not carried out because of patient refusal: Secondary | ICD-10-CM

## 2023-07-11 MED ORDER — MOUNJARO 2.5 MG/0.5ML ~~LOC~~ SOAJ: 2.5000 mg | SUBCUTANEOUS | 0 refills | Status: DC

## 2023-07-11 MED ORDER — ATORVASTATIN CALCIUM 10 MG PO TABS: 10.0000 mg | ORAL_TABLET | Freq: Every day | ORAL | 11 refills | Status: AC

## 2023-07-11 NOTE — Progress Notes (Signed)
Madelaine Bhat, CMA,acting as a Neurosurgeon for Arnette Felts, FNP.,have documented all relevant documentation on the behalf of Arnette Felts, FNP,as directed by  Arnette Felts, FNP while in the presence of Arnette Felts, FNP.  Subjective:  Patient ID: Rachael White , female    DOB: 10-10-1965 , 57 y.o.   MRN: [id number]  Chief Complaint  Patient presents with   Hypertension   Diabetes    HPI  Patient presents today for a bp and dm follow up, Patient reports compliance with medication. Patient denies any chest pain, SOB, or headaches. Patient has no concerns today. Her goal is to lose about 25 lbs.   Wt Readings from Last 3 Encounters: 07/11/23 : 196 lb 3.2 oz (89 kg) 02/23/23 : 197 lb 6.4 oz (89.5 kg) 12/29/22 : 190 lb (86.2 kg)    Diabetes She presents for her follow-up diabetic visit. She has type 2 diabetes mellitus. Pertinent negatives for hypoglycemia include no headaches. There are no diabetic associated symptoms. Pertinent negatives for diabetes include no polydipsia, no polyphagia and no polyuria. There are no diabetic complications. Risk factors for coronary artery disease include obesity, sedentary lifestyle and diabetes mellitus. Current diabetic treatment includes oral agent (monotherapy). She is compliant with treatment most of the time. She is following a generally healthy diet. She participates in exercise three times a week (she is trying to get back in the swing of things since having surgery in March.). There is no change in her home blood glucose trend. (Not checking on a regular basis) She does not see a podiatrist.Eye exam is not current (appt first of December).     Past Medical History:  Diagnosis Date   Allergy    Anemia    Anxiety    GERD (gastroesophageal reflux disease)    Pre-diabetes      Family History  Problem Relation Age of Onset   Diabetes Mother    Heart disease Mother    Cancer Father        prostate   Diabetes Brother    Pancreatic cancer  Maternal Aunt    Diabetes Maternal Grandmother    Colon polyps Neg Hx    Colon cancer Neg Hx      Current Outpatient Medications:    Accu-Chek Softclix Lancets lancets, Use as instructed to check blood sugars twice daily E11.69, Disp: 100 each, Rfl: 2   aluminum & magnesium hydroxide-simethicone (MYLANTA) 500-450-40 MG/5ML suspension, Take 15 mLs by mouth as needed for indigestion. For stomach, Disp: , Rfl:    amphetamine-dextroamphetamine (ADDERALL) 20 MG tablet, Take 30 mg by mouth 2 (two) times daily., Disp: , Rfl:    atorvastatin (LIPITOR) 10 MG tablet, Take 1 tablet (10 mg total) by mouth daily., Disp: 30 tablet, Rfl: 11   Blood Glucose Monitoring Suppl (ACCU-CHEK AVIVA PLUS) w/Device KIT, Use to check blood sugar twice a day., Disp: 1 kit, Rfl: 3   Blood Glucose Monitoring Suppl (ACCU-CHEK GUIDE ME) w/Device KIT, Use to check blood sugars twice daily E11.69, Disp: 1 kit, Rfl: 1   desloratadine (CLARINEX) 5 MG tablet, TAKE 1 TABLET (5 MG TOTAL) BY MOUTH DAILY., Disp: 90 tablet, Rfl: 1   diazepam (VALIUM) 5 MG tablet, Take 5 mg by mouth daily as needed for anxiety., Disp: , Rfl:    Glucagon (GVOKE HYPOPEN 2-PACK) 0.5 MG/0.1ML SOAJ, Inject 0.1 mLs into the skin as needed., Disp: 0.2 mL, Rfl: 2   glucose blood (ACCU-CHEK GUIDE) test strip, Use as instructed to check  blood sugars twice daily E11.69, Disp: 100 each, Rfl: 2   glucose blood (ACCU-CHEK GUIDE) test strip, Use as instructed, Disp: 100 each, Rfl: 12   HYDROcodone-acetaminophen (NORCO/VICODIN) 5-325 MG tablet, Take 1 tablet by mouth every 6 (six) hours as needed for moderate pain or severe pain., Disp: 20 tablet, Rfl: 0   ibuprofen (ADVIL) 200 MG tablet, Take 400 mg by mouth as needed for headache or moderate pain., Disp: , Rfl:    ketotifen (ZADITOR) 0.025 % ophthalmic solution, Place 1 drop into both eyes as needed (allergies.)., Disp: , Rfl:    Lancets Misc. (ACCU-CHEK FASTCLIX LANCET) KIT, Use with accu-chek guide me, Disp: 1 kit,  Rfl: 3   MOBIC 15 MG tablet, Take 15 mg by mouth daily as needed for pain., Disp: , Rfl:    nystatin powder, Apply 1 Application topically 3 (three) times daily. (Patient taking differently: Apply 1 Application topically as needed (rash).), Disp: 60 g, Rfl: 5   venlafaxine XR (EFFEXOR-XR) 75 MG 24 hr capsule, Take 75 mg by mouth in the morning, at noon, and at bedtime., Disp: , Rfl: 12   Vitamin D, Ergocalciferol, (DRISDOL) 1.25 MG (50000 UNIT) CAPS capsule, Take 1 capsule (50,000 Units total) by mouth every 7 (seven) days. (Patient taking differently: Take 50,000 Units by mouth every Sunday.), Disp: 24 capsule, Rfl: 1   tirzepatide (MOUNJARO) 2.5 MG/0.5ML Pen, Inject 2.5 mg into the skin once a week., Disp: 2 mL, Rfl: 0   Allergies  Allergen Reactions   Penicillins Rash    Unknown childhood allergy.  Has patient had a PCN reaction causing immediate rash, facial/tongue/throat swelling, SOB or lightheadedness with hypotension: No Has patient had a PCN reaction causing severe rash involving mucus membranes or skin necrosis: No Has patient had a PCN reaction that required hospitalization: No Has patient had a PCN reaction occurring within the last 10 years: No If all of the above answers are "NO", then may proceed with Cephalosporin use.     Review of Systems  Constitutional: Negative.   HENT: Negative.    Eyes: Negative.   Respiratory: Negative.    Cardiovascular: Negative.   Gastrointestinal: Negative.   Endocrine: Negative.  Negative for polydipsia, polyphagia and polyuria.  Genitourinary: Negative.   Musculoskeletal: Negative.   Skin: Negative.   Allergic/Immunologic: Negative.   Neurological:  Negative for headaches.  Hematological: Negative.   Psychiatric/Behavioral: Negative.       Today's Vitals   07/11/23 1541  BP: 120/80  Pulse: 79  Temp: 98.5 F (36.9 C)  TempSrc: Oral  Weight: 196 lb 3.2 oz (89 kg)  Height: 5\' 6"  (1.676 m)  PainSc: 0-No pain   Body mass index is  31.67 kg/m.  Wt Readings from Last 3 Encounters:  07/11/23 196 lb 3.2 oz (89 kg)  02/23/23 197 lb 6.4 oz (89.5 kg)  12/29/22 190 lb (86.2 kg)      Objective:  Physical Exam Vitals reviewed.  Constitutional:      General: She is not in acute distress.    Appearance: Normal appearance. She is well-developed. She is obese.  Cardiovascular:     Rate and Rhythm: Normal rate and regular rhythm.     Pulses: Normal pulses.     Heart sounds: Normal heart sounds. No murmur heard. Pulmonary:     Effort: Pulmonary effort is normal. No respiratory distress.     Breath sounds: Normal breath sounds. No wheezing.  Chest:     Chest wall: No tenderness.  Musculoskeletal:  General: Normal range of motion.  Skin:    General: Skin is warm and dry.     Capillary Refill: Capillary refill takes less than 2 seconds.     Coloration: Skin is not jaundiced.     Findings: No bruising.  Neurological:     General: No focal deficit present.     Mental Status: She is alert and oriented to person, place, and time.     Cranial Nerves: No cranial nerve deficit.     Motor: No weakness.  Psychiatric:        Mood and Affect: Mood normal.        Behavior: Behavior normal.        Thought Content: Thought content normal.        Judgment: Judgment normal.         Assessment And Plan:  Type 2 diabetes mellitus with obesity (HCC) Assessment & Plan: She is not taking Mounjaro at this time, will recheck HgbA1c. Continue focusing on Healthy Diet  Orders: -     Hemoglobin A1c -     BMP8+eGFR -     Mounjaro; Inject 2.5 mg into the skin once a week.  Dispense: 2 mL; Refill: 0 -     Lipid panel -     Atorvastatin Calcium; Take 1 tablet (10 mg total) by mouth daily.  Dispense: 30 tablet; Refill: 11  COVID-19 vaccination declined Assessment & Plan: Declines covid 19 vaccine. Discussed risk of covid 58 and if she changes her mind about the vaccine to call the office. Education has been provided regarding  the importance of this vaccine but patient still declined. Advised may receive this vaccine at local pharmacy or Health Dept.or vaccine clinic. Aware to provide a copy of the vaccination record if obtained from local pharmacy or Health Dept.  Encouraged to take multivitamin, vitamin d, vitamin c and zinc to increase immune system. Aware can call office if would like to have vaccine here at office. Verbalized acceptance and understanding.     Influenza vaccination declined Assessment & Plan: Patient declined influenza vaccination at this time. Patient is aware that influenza vaccine prevents illness in 70% of healthy people, and reduces hospitalizations to 30-70% in elderly. This vaccine is recommended annually. Education has been provided regarding the importance of this vaccine but patient still declined. Advised may receive this vaccine at local pharmacy or Health Dept.or vaccine clinic. Aware to provide a copy of the vaccination record if obtained from local pharmacy or Health Dept.  Pt is willing to accept risk associated with refusing vaccination.    Class 1 obesity due to excess calories without serious comorbidity with body mass index (BMI) of 31.0 to 31.9 in adult Assessment & Plan: She is encouraged to strive for BMI less than 30 to decrease cardiac risk. Advised to aim for at least 150 minutes of exercise per week. Weight remains stable.      Return for controlled DM check 3- 4 months and HM.  Patient was given opportunity to ask questions. Patient verbalized understanding of the plan and was able to repeat key elements of the plan. All questions were answered to their satisfaction.   Jeanell Sparrow, FNP, have reviewed all documentation for this visit. The documentation on 07/11/23 for the exam, diagnosis, procedures, and orders are all accurate and complete.   IF YOU HAVE BEEN REFERRED TO A SPECIALIST, IT MAY TAKE 1-2 WEEKS TO SCHEDULE/PROCESS THE REFERRAL. IF YOU HAVE NOT HEARD  FROM US/SPECIALIST IN  TWO WEEKS, PLEASE GIVE [place] A CALL AT [phone].

## 2023-07-12 LAB — BMP8+EGFR
BUN/Creatinine Ratio: 14 (ref 9–23)
BUN: 10 mg/dL (ref 6–24)
CO2: 27 mmol/L (ref 20–29)
Calcium: 9.1 mg/dL (ref 8.7–10.2)
Chloride: 102 mmol/L (ref 96–106)
Creatinine, Ser: 0.72 mg/dL (ref 0.57–1.00)
Glucose: 97 mg/dL (ref 70–99)
Potassium: 3.6 mmol/L (ref 3.5–5.2)
Sodium: 142 mmol/L (ref 134–144)
eGFR: 98 mL/min/{1.73_m2} (ref >59)

## 2023-07-12 LAB — LIPID PANEL
Chol/HDL Ratio: 3.3 ratio (ref 0.0–4.4)
Cholesterol, Total: 196 mg/dL (ref 100–199)
HDL: 59 mg/dL (ref >39)
LDL Chol Calc (NIH): 121 mg/dL — ABNORMAL HIGH (ref 0–99)
Triglycerides: 89 mg/dL (ref 0–149)
VLDL Cholesterol Cal: 16 mg/dL (ref 5–40)

## 2023-07-12 LAB — HEMOGLOBIN A1C
Est. average glucose Bld gHb Est-mCnc: 123 mg/dL
Hgb A1c MFr Bld: 5.9 % — ABNORMAL HIGH (ref 4.8–5.6)

## 2023-07-22 NOTE — Assessment & Plan Note (Signed)
She is encouraged to strive for BMI less than 30 to decrease cardiac risk. Advised to aim for at least 150 minutes of exercise per week. Weight remains stable.

## 2023-07-22 NOTE — Assessment & Plan Note (Signed)
She is not taking Mounjaro at this time, will recheck HgbA1c. Continue focusing on Healthy Diet

## 2023-07-22 NOTE — Assessment & Plan Note (Signed)

## 2023-07-22 NOTE — Assessment & Plan Note (Signed)
Declines covid 19 vaccine. Discussed risk of covid 70 and if she changes her mind about the vaccine to call the office. Education has been provided regarding the importance of this vaccine but patient still declined. Advised may receive this vaccine at local pharmacy or Health Dept.or vaccine clinic. Aware to provide a copy of the vaccination record if obtained from local pharmacy or Health Dept.  Encouraged to take multivitamin, vitamin d, vitamin c and zinc to increase immune system. Aware can call office if would like to have vaccine here at office. Verbalized acceptance and understanding.

## 2023-07-31 ENCOUNTER — Encounter: Payer: Self-pay | Admitting: Nurse Practitioner

## 2023-08-04 ENCOUNTER — Telehealth: Payer: Self-pay

## 2023-08-04 NOTE — Telephone Encounter (Signed)
PRIOR AUTH WAS APPROVED FOR MOUNJARO WAS APPROVED.

## 2023-08-15 ENCOUNTER — Other Ambulatory Visit: Payer: Self-pay

## 2023-08-15 DIAGNOSIS — E669 Obesity, unspecified: Secondary | ICD-10-CM

## 2023-08-15 MED ORDER — MOUNJARO 2.5 MG/0.5ML ~~LOC~~ SOAJ: 2.5000 mg | SUBCUTANEOUS | 0 refills | Status: DC

## 2023-09-14 ENCOUNTER — Other Ambulatory Visit: Payer: Self-pay | Admitting: Nurse Practitioner

## 2023-10-12 ENCOUNTER — Encounter: Payer: No Typology Code available for payment source | Admitting: Nurse Practitioner

## 2024-02-20 ENCOUNTER — Ambulatory Visit: Admitting: Nurse Practitioner

## 2024-02-20 ENCOUNTER — Encounter: Payer: Self-pay | Admitting: Nurse Practitioner

## 2024-02-20 VITALS — BP 130/80 | HR 84 | Temp 98.5°F | Ht 66.0 in | Wt 203.0 lb

## 2024-02-20 DIAGNOSIS — Z780 Asymptomatic menopausal state: Secondary | ICD-10-CM

## 2024-02-20 DIAGNOSIS — R4189 Other symptoms and signs involving cognitive functions and awareness: Secondary | ICD-10-CM | POA: Diagnosis not present

## 2024-02-20 DIAGNOSIS — Z2821 Immunization not carried out because of patient refusal: Secondary | ICD-10-CM

## 2024-02-20 DIAGNOSIS — Z6831 Body mass index (BMI) 31.0-31.9, adult: Secondary | ICD-10-CM

## 2024-02-20 DIAGNOSIS — Z6832 Body mass index (BMI) 32.0-32.9, adult: Secondary | ICD-10-CM

## 2024-02-20 DIAGNOSIS — E66811 Obesity, class 1: Secondary | ICD-10-CM

## 2024-02-20 DIAGNOSIS — E1169 Type 2 diabetes mellitus with other specified complication: Secondary | ICD-10-CM

## 2024-02-20 DIAGNOSIS — E559 Vitamin D deficiency, unspecified: Secondary | ICD-10-CM | POA: Diagnosis not present

## 2024-02-20 DIAGNOSIS — Z0000 Encounter for general adult medical examination without abnormal findings: Secondary | ICD-10-CM | POA: Diagnosis not present

## 2024-02-20 DIAGNOSIS — E6609 Other obesity due to excess calories: Secondary | ICD-10-CM

## 2024-02-20 LAB — POCT URINALYSIS DIP (CLINITEK)
Glucose, UA: NEGATIVE mg/dL
Ketones, POC UA: NEGATIVE mg/dL
Leukocytes, UA: NEGATIVE
Nitrite, UA: NEGATIVE
POC PROTEIN,UA: 30 — AB
Spec Grav, UA: >=1.03 — AB (ref 1.010–1.025)
Urobilinogen, UA: 2 U/dL — AB
pH, UA: 6 (ref 5.0–8.0)

## 2024-02-20 NOTE — Assessment & Plan Note (Signed)
 Patient educated to increase activity to 150 minutes per week, increase water intake to 1 gallon per day, and eat a low fat/sugar and high protein/fiber diet.

## 2024-02-20 NOTE — Patient Instructions (Addendum)
 You may try over the counter black cohash, red clover, or vitamin e for menopausal symptoms.  See below:  Nutrition and key nutrients are critical for long-term health in menopause. A balanced diet with adequate protein, vitamin D , calcium , B vitamins, and vitamin C is recommended to support bone health, reduce cardiovascular risk, and maintain overall metabolic health.

## 2024-02-20 NOTE — Assessment & Plan Note (Signed)
 Hgb A1C drawn, patient encouraged to continue increasing physical activity as well as eat a well balanced diet that is low in sugars and carbohydrates and high in fibers and proteins.  Mounjaro  increased to 5 mg dosage.

## 2024-02-20 NOTE — Progress Notes (Signed)
 LILLETTE Kristeen JINNY Gladis, CMA,acting as a Neurosurgeon for Gaines Ada, FNP.,have documented all relevant documentation on the behalf of Gaines Ada, FNP,as directed by  Gaines Ada, FNP while in the presence of Gaines Ada, FNP.  Subjective:    Patient ID: Rachael White , female    DOB: 1966-06-21 , 58 y.o.   MRN: [id number]  Chief Complaint  Patient presents with   Annual Exam    Patient presents today for HM, Patient reports compliance with medication. Patient denies any chest pain, SOB, or headaches. Patient has no concerns today.     HPI  Patient presents today for annual physical, no recent visits.    Seasonal allergies controled.  Sees gyn for pap smears, next due in 2026. Feels that menopausal symptoms have decreased and are manageable at this time.  Eye exam appointment in 3 weeks with [facility].   Endorses brain fog at times since ending menopause.  Does not affect activities of daily living.    Feels that anxiety is well controlled with medications prescribed.   Pain has had an increase in her weight.  Patient feels Mounjaro  is not decreasing her appetite, snacking has increased.  Goal is 160-170 lbs.   Patient is exercising 3 days a week 30-45 minutes at a time doing weights, walking a stretching.  Wants to start getting back on pool for low impact cardio.  Has been eating more high protein, lower fact foods.  She tries to ensure that she eats before 6 pm and not snack late at night.  She is more conscious of salt intake and limits intake, swaps quinoa for pasta.       Past Medical History:  Diagnosis Date   Allergy    Anemia    Anxiety    GERD (gastroesophageal reflux disease)    Pre-diabetes      Family History  Problem Relation Age of Onset   Diabetes Mother    Heart disease Mother    Cancer Father        prostate   Diabetes Brother    Pancreatic cancer Maternal Aunt    Diabetes Maternal Grandmother    Colon polyps Neg Hx    Colon cancer Neg Hx      Current  Outpatient Medications:    Accu-Chek Softclix Lancets lancets, Use as instructed to check blood sugars twice daily E11.69, Disp: 100 each, Rfl: 2   aluminum & magnesium  hydroxide-simethicone  (MYLANTA) 500-450-40 MG/5ML suspension, Take 15 mLs by mouth as needed for indigestion. For stomach, Disp: , Rfl:    amphetamine -dextroamphetamine  (ADDERALL) 20 MG tablet, Take 30 mg by mouth 2 (two) times daily., Disp: , Rfl:    atorvastatin  (LIPITOR) 10 MG tablet, Take 1 tablet (10 mg total) by mouth daily., Disp: 30 tablet, Rfl: 11   Blood Glucose Monitoring Suppl (ACCU-CHEK AVIVA PLUS) w/Device KIT, Use to check blood sugar twice a day., Disp: 1 kit, Rfl: 3   Blood Glucose Monitoring Suppl (ACCU-CHEK GUIDE ME) w/Device KIT, Use to check blood sugars twice daily E11.69, Disp: 1 kit, Rfl: 1   desloratadine  (CLARINEX ) 5 MG tablet, TAKE 1 TABLET (5 MG TOTAL) BY MOUTH DAILY., Disp: 90 tablet, Rfl: 1   diazepam  (VALIUM ) 5 MG tablet, Take 5 mg by mouth daily as needed for anxiety., Disp: , Rfl:    Glucagon  (GVOKE HYPOPEN  2-PACK) 0.5 MG/0.1ML SOAJ, Inject 0.1 mLs into the skin as needed., Disp: 0.2 mL, Rfl: 2   glucose blood (ACCU-CHEK GUIDE) test strip, Use  as instructed to check blood sugars twice daily E11.69, Disp: 100 each, Rfl: 2   glucose blood (ACCU-CHEK GUIDE) test strip, Use as instructed, Disp: 100 each, Rfl: 12   HYDROcodone -acetaminophen  (NORCO/VICODIN) 5-325 MG tablet, Take 1 tablet by mouth every 6 (six) hours as needed for moderate pain or severe pain., Disp: 20 tablet, Rfl: 0   ibuprofen (ADVIL) 200 MG tablet, Take 400 mg by mouth as needed for headache or moderate pain., Disp: , Rfl:    ketotifen  (ZADITOR ) 0.025 % ophthalmic solution, Place 1 drop into both eyes as needed (allergies.)., Disp: , Rfl:    Lancets Misc. (ACCU-CHEK FASTCLIX LANCET) KIT, Use with accu-chek guide me, Disp: 1 kit, Rfl: 3   MOBIC 15 MG tablet, Take 15 mg by mouth daily as needed for pain., Disp: , Rfl:    nystatin  powder,  Apply 1 Application topically 3 (three) times daily. (Patient taking differently: Apply 1 Application topically as needed (rash).), Disp: 60 g, Rfl: 5   tirzepatide  (MOUNJARO ) 5 MG/0.5ML Pen, Inject 5 mg into the skin once a week., Disp: 2 mL, Rfl: 0   venlafaxine XR (EFFEXOR-XR) 75 MG 24 hr capsule, Take 75 mg by mouth in the morning, at noon, and at bedtime., Disp: , Rfl: 12   Vitamin D , Ergocalciferol , (DRISDOL ) 1.25 MG (50000 UNIT) CAPS capsule, Take 1 capsule (50,000 Units total) by mouth every 7 (seven) days. (Patient taking differently: Take 50,000 Units by mouth every Sunday.), Disp: 24 capsule, Rfl: 1   Allergies  Allergen Reactions   Penicillins Rash    Unknown childhood allergy.  Has patient had a PCN reaction causing immediate rash, facial/tongue/throat swelling, SOB or lightheadedness with hypotension: No Has patient had a PCN reaction causing severe rash involving mucus membranes or skin necrosis: No Has patient had a PCN reaction that required hospitalization: No Has patient had a PCN reaction occurring within the last 10 years: No If all of the above answers are NO, then may proceed with Cephalosporin use.      . The patient's tobacco use is:  Social History   Tobacco Use  Smoking Status Never  Smokeless Tobacco Never  . She has been exposed to passive smoke. The patient's alcohol use is:  Social History   Substance and Sexual Activity  Alcohol Use Yes   Alcohol/week: 1.0 - 3.0 standard drink of alcohol   Types: 1 - 3 Glasses of wine per week   Comment: wine occ  . Additional information: Last pap 2023, next one scheduled for 2026.    Review of Systems  Constitutional: Negative.   HENT: Negative.    Eyes: Negative.   Respiratory: Negative.    Cardiovascular: Negative.   Gastrointestinal: Negative.   Endocrine: Negative.   Genitourinary: Negative.   Musculoskeletal: Negative.   Skin: Negative.   Allergic/Immunologic: Negative.   Neurological: Negative.    Hematological: Negative.   Psychiatric/Behavioral: Negative.       Today's Vitals   02/20/24 1451  BP: 130/80  Pulse: 84  Temp: 98.5 F (36.9 C)  TempSrc: Oral  Weight: 203 lb (92.1 kg)  Height: 5' 6 (1.676 m)  PainSc: 0-No pain   Body mass index is 32.77 kg/m.  Wt Readings from Last 3 Encounters:  02/20/24 203 lb (92.1 kg)  07/11/23 196 lb 3.2 oz (89 kg)  02/23/23 197 lb 6.4 oz (89.5 kg)     Objective:  Physical Exam Constitutional:      General: She is not in acute distress.  Appearance: Normal appearance. She is well-developed. She is obese.  HENT:     Head: Normocephalic and atraumatic.     Right Ear: Hearing, tympanic membrane, ear canal and external ear normal. There is no impacted cerumen.     Left Ear: Hearing, tympanic membrane, ear canal and external ear normal. There is no impacted cerumen.     Nose: Nose normal.     Mouth/Throat:     Mouth: Mucous membranes are moist.   Eyes:     General: Lids are normal.     Extraocular Movements: Extraocular movements intact.     Conjunctiva/sclera: Conjunctivae normal.     Pupils: Pupils are equal, round, and reactive to light.     Funduscopic exam:    Right eye: No papilledema.        Left eye: No papilledema.   Neck:     Thyroid: No thyroid mass.     Vascular: No carotid bruit.   Cardiovascular:     Rate and Rhythm: Normal rate and regular rhythm.     Pulses: Normal pulses.     Heart sounds: Normal heart sounds. No murmur heard. Pulmonary:     Effort: Pulmonary effort is normal. No respiratory distress.     Breath sounds: Normal breath sounds. No wheezing.  Chest:     Chest wall: No mass.  Breasts:    Tanner Score is 5.     Right: Normal. No mass or tenderness.     Left: Normal. No mass or tenderness.  Abdominal:     General: Abdomen is flat. Bowel sounds are normal. There is no distension.     Palpations: Abdomen is soft.     Tenderness: There is no abdominal tenderness.   Musculoskeletal:         General: No swelling. Normal range of motion.     Cervical back: Full passive range of motion without pain, normal range of motion and neck supple.     Right lower leg: No edema.     Left lower leg: No edema.  Lymphadenopathy:     Upper Body:     Right upper body: No supraclavicular, axillary or pectoral adenopathy.     Left upper body: No supraclavicular, axillary or pectoral adenopathy.   Skin:    General: Skin is warm and dry.     Capillary Refill: Capillary refill takes less than 2 seconds.   Neurological:     General: No focal deficit present.     Mental Status: She is alert and oriented to person, place, and time.     Cranial Nerves: No cranial nerve deficit.     Sensory: No sensory deficit.   Psychiatric:        Mood and Affect: Mood normal.        Behavior: Behavior normal.        Thought Content: Thought content normal.        Judgment: Judgment normal.         Assessment And Plan:     Encounter for annual health examination Assessment & Plan: Behavior modifications discussed and diet history reviewed.   Pt will continue to exercise regularly and modify diet with low GI, plant based foods and decrease intake of processed foods.  Recommend intake of daily multivitamin, Vitamin D , and calcium .  Recommend mammogram and colonoscopy for preventive screenings, as well as recommend immunizations that include influenza, TDAP, and Shingles   Orders: -     CBC with Differential/Platelet -  CMP14+EGFR -     Lipid panel  Type 2 diabetes mellitus with obesity (HCC) Assessment & Plan: Hgb A1C drawn, patient encouraged to continue increasing physical activity as well as eat a well balanced diet that is low in sugars and carbohydrates and high in fibers and proteins.  Mounjaro  increased to 5 mg dosage.    Orders: -     EKG 12-Lead -     POCT URINALYSIS DIP (CLINITEK) -     Microalbumin / creatinine urine ratio -     Hemoglobin A1c -     Mounjaro ; Inject 5 mg into  the skin once a week.  Dispense: 2 mL; Refill: 0  Vitamin D  deficiency Assessment & Plan: Vitamin D  level drawn, encouraged continued supplementation for reduction of bone loss and menopause support.    Orders: -     VITAMIN D  25 Hydroxy (Vit-D Deficiency, Fractures)  COVID-19 vaccination declined Assessment & Plan: Declines covid 19 vaccine. Discussed risk of covid 60 and if she changes her mind about the vaccine to call the office. Education has been provided regarding the importance of this vaccine but patient still declined. Advised may receive this vaccine at local pharmacy or Health Dept.or vaccine clinic. Aware to provide a copy of the vaccination record if obtained from local pharmacy or Health Dept.  Encouraged to take multivitamin, vitamin d , vitamin c and zinc to increase immune system. Aware can call office if would like to have vaccine here at office. Verbalized acceptance and understanding.     BMI 32.0-32.9,adult Assessment & Plan: She is encouraged to strive for BMI less than 30 to decrease cardiac risk. Advised to aim for at least 150 minutes of exercise per week.    Brain fog Assessment & Plan: Likely related to menopause. Encouraged to stay well hydrated and to do brain exercises  Orders: -     Vitamin B12  Menopause Assessment & Plan: Menopause severity assessed, currently not affecting activities of daily living, and symptoms are improved from last visit.  Information on black cohosh and red clover supplementation provided as well as nutritional support.     Class 1 obesity due to excess calories without serious comorbidity with body mass index (BMI) of 31.0 to 31.9 in adult Assessment & Plan: Patient educated to increase activity to 150 minutes per week, increase water intake to 1 gallon per day, and eat a low fat/sugar and high protein/fiber diet.        Return for 1 year physical, controlled DM check 4 months. Patient was given opportunity to ask  questions. Patient verbalized understanding of the plan and was able to repeat key elements of the plan. All questions were answered to their satisfaction.   Gaines Ada, FNP  I, Gaines Ada, FNP, have reviewed all documentation for this visit. The documentation on 02/20/24 for the exam, diagnosis, procedures, and orders are all accurate and complete.

## 2024-02-20 NOTE — Assessment & Plan Note (Signed)
 Menopause severity assessed, currently not affecting activities of daily living, and symptoms are improved from last visit.  Information on black cohosh and red clover supplementation provided as well as nutritional support.

## 2024-02-20 NOTE — Assessment & Plan Note (Signed)
 Vitamin D  level drawn, encouraged continued supplementation for reduction of bone loss and menopause support.

## 2024-02-21 ENCOUNTER — Other Ambulatory Visit: Payer: Self-pay | Admitting: Nurse Practitioner

## 2024-02-21 DIAGNOSIS — E1169 Type 2 diabetes mellitus with other specified complication: Secondary | ICD-10-CM

## 2024-02-25 DIAGNOSIS — Z6832 Body mass index (BMI) 32.0-32.9, adult: Secondary | ICD-10-CM | POA: Insufficient documentation

## 2024-02-25 DIAGNOSIS — R4189 Other symptoms and signs involving cognitive functions and awareness: Secondary | ICD-10-CM | POA: Insufficient documentation

## 2024-02-25 DIAGNOSIS — Z0000 Encounter for general adult medical examination without abnormal findings: Secondary | ICD-10-CM | POA: Insufficient documentation

## 2024-02-25 MED ORDER — MOUNJARO 5 MG/0.5ML ~~LOC~~ SOAJ: 5.0000 mg | SUBCUTANEOUS | 0 refills | Status: DC

## 2024-02-25 NOTE — Assessment & Plan Note (Signed)
 She is encouraged to strive for BMI less than 30 to decrease cardiac risk. Advised to aim for at least 150 minutes of exercise per week.

## 2024-02-25 NOTE — Assessment & Plan Note (Signed)

## 2024-02-25 NOTE — Assessment & Plan Note (Signed)

## 2024-02-25 NOTE — Assessment & Plan Note (Signed)
 Likely related to menopause. Encouraged to stay well hydrated and to do brain exercises

## 2024-03-26 ENCOUNTER — Other Ambulatory Visit: Payer: Self-pay | Admitting: Nurse Practitioner

## 2024-03-26 DIAGNOSIS — E1169 Type 2 diabetes mellitus with other specified complication: Secondary | ICD-10-CM

## 2024-06-05 ENCOUNTER — Other Ambulatory Visit: Payer: Self-pay | Admitting: Internal Medicine

## 2024-06-05 ENCOUNTER — Other Ambulatory Visit: Payer: Self-pay | Admitting: Nurse Practitioner

## 2024-06-05 DIAGNOSIS — Z1231 Encounter for screening mammogram for malignant neoplasm of breast: Secondary | ICD-10-CM

## 2024-06-07 ENCOUNTER — Inpatient Hospital Stay: Admission: RE | Admit: 2024-06-07 | Payer: No Typology Code available for payment source | Source: Ambulatory Visit

## 2024-06-24 ENCOUNTER — Ambulatory Visit: Admitting: Nurse Practitioner

## 2024-07-11 ENCOUNTER — Ambulatory Visit: Payer: Self-pay | Admitting: Nurse Practitioner

## 2024-07-17 ENCOUNTER — Other Ambulatory Visit: Payer: Self-pay | Admitting: Nurse Practitioner

## 2024-07-17 DIAGNOSIS — E669 Obesity, unspecified: Secondary | ICD-10-CM

## 2024-08-27 ENCOUNTER — Ambulatory Visit: Payer: Self-pay | Admitting: Nurse Practitioner

## 2024-09-30 ENCOUNTER — Ambulatory Visit: Admitting: Nurse Practitioner

## 2024-11-12 ENCOUNTER — Ambulatory Visit: Admitting: Nurse Practitioner

## 2025-02-20 ENCOUNTER — Encounter: Payer: Self-pay | Admitting: Nurse Practitioner
